# Patient Record
Sex: Female | Born: 1945 | Race: White | Hispanic: No | Marital: Married | Smoking: Former smoker
Health system: Southern US, Community
[De-identification: ages and names within clinical notes are randomized; demographics above are authoritative.]

## PROBLEM LIST (undated history)

## (undated) DIAGNOSIS — G4730 Sleep apnea, unspecified: Secondary | ICD-10-CM

## (undated) DIAGNOSIS — J439 Emphysema, unspecified: Secondary | ICD-10-CM

## (undated) DIAGNOSIS — E785 Hyperlipidemia, unspecified: Secondary | ICD-10-CM

## (undated) DIAGNOSIS — G4761 Periodic limb movement disorder: Secondary | ICD-10-CM

## (undated) DIAGNOSIS — D259 Leiomyoma of uterus, unspecified: Secondary | ICD-10-CM

## (undated) DIAGNOSIS — I10 Essential (primary) hypertension: Secondary | ICD-10-CM

## (undated) HISTORY — PX: KNEE ARTHROSCOPY: SUR90

## (undated) HISTORY — PX: APPENDECTOMY: SHX54

## (undated) HISTORY — DX: Hyperlipidemia, unspecified: E78.5

## (undated) HISTORY — DX: Essential (primary) hypertension: I10

## (undated) HISTORY — DX: Leiomyoma of uterus, unspecified: D25.9

## (undated) HISTORY — DX: Periodic limb movement disorder: G47.61

---

## 1996-04-27 HISTORY — PX: BILATERAL SALPINGOOPHORECTOMY: SHX1223

## 1996-04-27 HISTORY — PX: TOTAL ABDOMINAL HYSTERECTOMY: SHX209

## 2003-08-30 ENCOUNTER — Encounter (INDEPENDENT_AMBULATORY_CARE_PROVIDER_SITE_OTHER): Payer: Self-pay | Admitting: *Deleted

## 2003-08-30 ENCOUNTER — Ambulatory Visit (HOSPITAL_COMMUNITY): Admission: RE | Admit: 2003-08-30 | Discharge: 2003-08-30 | Payer: Self-pay | Admitting: Gastroenterology

## 2007-08-18 ENCOUNTER — Ambulatory Visit: Payer: Self-pay | Admitting: Pulmonary Disease

## 2007-08-18 DIAGNOSIS — I10 Essential (primary) hypertension: Secondary | ICD-10-CM | POA: Insufficient documentation

## 2007-08-18 DIAGNOSIS — E785 Hyperlipidemia, unspecified: Secondary | ICD-10-CM | POA: Insufficient documentation

## 2007-09-12 ENCOUNTER — Ambulatory Visit: Payer: Self-pay | Admitting: Pulmonary Disease

## 2007-09-12 DIAGNOSIS — J449 Chronic obstructive pulmonary disease, unspecified: Secondary | ICD-10-CM | POA: Insufficient documentation

## 2007-10-21 ENCOUNTER — Telehealth (INDEPENDENT_AMBULATORY_CARE_PROVIDER_SITE_OTHER): Payer: Self-pay | Admitting: *Deleted

## 2007-10-24 ENCOUNTER — Encounter: Payer: Self-pay | Admitting: Pulmonary Disease

## 2007-11-22 ENCOUNTER — Ambulatory Visit: Payer: Self-pay | Admitting: Pulmonary Disease

## 2008-04-23 ENCOUNTER — Ambulatory Visit: Payer: Self-pay | Admitting: Pulmonary Disease

## 2008-04-23 DIAGNOSIS — G2589 Other specified extrapyramidal and movement disorders: Secondary | ICD-10-CM | POA: Insufficient documentation

## 2008-06-22 ENCOUNTER — Ambulatory Visit: Payer: Self-pay | Admitting: Pulmonary Disease

## 2009-06-19 ENCOUNTER — Ambulatory Visit: Payer: Self-pay | Admitting: Pulmonary Disease

## 2009-06-19 DIAGNOSIS — J310 Chronic rhinitis: Secondary | ICD-10-CM | POA: Insufficient documentation

## 2010-04-01 ENCOUNTER — Telehealth (INDEPENDENT_AMBULATORY_CARE_PROVIDER_SITE_OTHER): Payer: Self-pay | Admitting: *Deleted

## 2010-04-10 ENCOUNTER — Ambulatory Visit: Payer: Self-pay | Admitting: Pulmonary Disease

## 2010-04-10 DIAGNOSIS — R011 Cardiac murmur, unspecified: Secondary | ICD-10-CM | POA: Insufficient documentation

## 2010-05-26 ENCOUNTER — Other Ambulatory Visit: Payer: Self-pay | Admitting: Gastroenterology

## 2010-05-27 NOTE — Progress Notes (Signed)
Summary: refills adviar 250-71mcg, spiriva, proair to [facility]  Phone Note Call from Patient Call back at [phone]   Caller: Patient Call For: sood Reason for Call: Refill Medication, Talk to Nurse Summary of Call: Patient needing refills--advair, proair, and spiriva.  [facility]  Pt. made an appt for 11/15 @ 9:30 Initial call taken by: Lehman Prom,  April 01, 2010 9:39 AM  Follow-up for Phone Call        called spoke with patient who requests refills on her spiriva, advair 250-32mcg and proair.  she has made appt w/ VS on 12.15.11 @ 0930.  refills sent to [facility].  pt advised to keep 12.15 appt. Follow-up by: Boone Master CNA/MA,  April 01, 2010 10:26 AM    Prescriptions: PROAIR HFA 108 (90 BASE) MCG/ACT  AERS (ALBUTEROL SULFATE) Two puffs four times daily as needed  #3 x 3   Entered by:   Boone Master CNA/MA   Authorized by:   Coralyn Helling MD   Signed by:   Boone Master CNA/MA on 04/01/2010   Method used:   Faxed to ...       [facility] (mail-order)       [address] [place], [place]  [address]       Ph: [phone]       Fax: [phone]   RxID:   [id number] ADVAIR DISKUS 250-50 MCG/DOSE  MISC (FLUTICASONE-SALMETEROL) Use one puff two times a day - rinse mouth.  #3 x 3   Entered by:   Boone Master CNA/MA   Authorized by:   Coralyn Helling MD   Signed by:   Boone Master CNA/MA on 04/01/2010   Method used:   Faxed to ...       [facility] (mail-order)       [address] [place], [place]  [address]       Ph: [phone]       Fax: [phone]   RxID:   [id number] SPIRIVA HANDIHALER 18 MCG  CAPS (TIOTROPIUM BROMIDE MONOHYDRATE) 1 puff once daily  #90 x 3   Entered by:   Boone Master CNA/MA   Authorized by:   Coralyn Helling MD   Signed by:   Boone Master CNA/MA on 04/01/2010   Method used:   Faxed to ...       [facility] (mail-order)       [address] [place], [place]  [address]       Ph: [phone]       Fax:  [phone]   RxID:   [id number]

## 2010-05-27 NOTE — Assessment & Plan Note (Signed)
Summary: rov/ mbw   Copy to:  Dr. Rodrigo Ran Primary Provider/Referring Provider:  Dr. Rodrigo Ran  CC:  Follow up visit-Chronic Asthma; Pt states her breathing is about the same as last visit; Does get SOB but with walking great distance. Send Sprivia, advair, and proair to [facility] for pt..  History of Present Illness: I saw Ms. Mutch in follow up for her chronic obstructive asthma.  She has been doing okay.  She uses her proair twice per day.  She gets winded when walking.  Otherwise she is not having much cough, wheeze, or sputum.  She has been getting more pain in her joints.  She has been getting sinus congestion, post-nasal drip, and hoarseness.  Current Medications (verified): 1)  Spiriva Handihaler 18 Mcg  Caps (Tiotropium Bromide Monohydrate) .Marland Kitchen.. 1 Puff Once Daily 2)  Advair Diskus 250-50 Mcg/dose  Misc (Fluticasone-Salmeterol) .... Use One Puff Two Times A Day - Rinse Mouth. 3)  Proair Hfa 108 (90 Base) Mcg/act  Aers (Albuterol Sulfate) .... Two Puffs Four Times Daily As Needed 4)  Diovan Hct 320-25 Mg  Tabs (Valsartan-Hydrochlorothiazide) .... One Table Daily. 5)  Crestor 10 Mg  Tabs (Rosuvastatin Calcium) .... One Tablet Daily. 6)  Fish Oil Concentrate 300 Mg  Caps (Omega-3 Fatty Acids) .... Use Four Daily. 7)  Adult Aspirin Ec Low Strength 81 Mg  Tbec (Aspirin) .... One Tablet Daily. 8)  Fosamax 70 Mg  Tabs (Alendronate Sodium) .... Take One Tablet Weekly. 9)  Calcium 600 1500 Mg  Tabs (Calcium Carbonate) .... One Tablet Twice Daily.  Allergies (verified): No Known Drug Allergies  Past History:  Past Medical History: Reviewed history from 06/22/2008 and no changes required. Chronic Obstructive Asthma Hyperlipidemia Hypertension Uterine Fibroid Periodic Limb Movements Osteoporosis  Past Surgical History: Reviewed history from 08/18/2007 and no changes required. TAH and BSO 1998 Left Knee Arthroscopy Appendectomy  Vital Signs:  Patient profile:   65  year old female Height:      65 inches Weight:      306.38 pounds BMI:     51.17 O2 Sat:      94 % on Room air Temp:     98.1 degrees F oral Pulse rate:   100 / minute BP sitting:   156 / 88  (left arm) Cuff size:   large  Vitals Entered By: Reynaldo Minium CMA (June 19, 2009 1:51 PM)  O2 Flow:  Room air  Physical Exam  General:  normal appearance, healthy appearing, and obese.   Nose:  no sinus tenderness or discharge, narrow nasal angle Mouth:  Mallampati 3 airway, low laying soft palate Neck:  no LAN, no Thyromegaly, no JVD Lungs:  prolonged exhalation, no wheezing or rales Heart:  regular rhythm, normal rate, and no murmurs.   Extremities:  no edema, cynanosis, or clubbing Cervical Nodes:  no significant adenopathy   Impression & Recommendations:  Problem # 1:  CHRONIC OBSTRUCTIVE ASTHMA UNSPECIFIED (ICD-493.20) She is to continue on her asthma regimen.  Problem # 2:  DYSPNEA (ICD-786.05) Likely multifactorial.  I believe a good portion of this is related to her obesity, and deconditioning.  Offered referral to pulmonary rehab.  She will take this under consideration, and let me know if this is something she would be interested in doing.  Problem # 3:  RHINITIS (ICD-472.0) Advised her to use nasal irrigation, and try OTC claritin for the next 3 or 4 days.  Complete Medication List: 1)  Spiriva Handihaler 18 Mcg  Caps (Tiotropium bromide monohydrate) .Marland Kitchen.. 1 puff once daily 2)  Advair Diskus 250-50 Mcg/dose Misc (Fluticasone-salmeterol) .... Use one puff two times a day - rinse mouth. 3)  Proair Hfa 108 (90 Base) Mcg/act Aers (Albuterol sulfate) .... Two puffs four times daily as needed 4)  Diovan Hct 320-25 Mg Tabs (Valsartan-hydrochlorothiazide) .... One table daily. 5)  Crestor 10 Mg Tabs (Rosuvastatin calcium) .... One tablet daily. 6)  Fish Oil Concentrate 300 Mg Caps (Omega-3 fatty acids) .... Use four daily. 7)  Adult Aspirin Ec Low Strength 81 Mg Tbec (Aspirin)  .... One tablet daily. 8)  Fosamax 70 Mg Tabs (Alendronate sodium) .... Take one tablet weekly. 9)  Calcium 600 1500 Mg Tabs (Calcium carbonate) .... One tablet twice daily.  Other Orders: Est. Patient Level III (60454)  Patient Instructions: 1)  Try using neti pott or nasal irrigation 2)  Use claritin 10 mg once daily for 3 or 4 days once daily, then as needed  3)  Follow up in 6 months Prescriptions: PROAIR HFA 108 (90 BASE) MCG/ACT  AERS (ALBUTEROL SULFATE) Two puffs four times daily as needed  #3 x 4   Entered and Authorized by:   Coralyn Helling MD   Signed by:   Coralyn Helling MD on 06/19/2009   Method used:   Printed then faxed to ...       [facility] (mail-order)       [address] [place], [place]  [address]       Ph: [phone]       Fax: [phone]   RxID:   [id number] ADVAIR DISKUS 250-50 MCG/DOSE  MISC (FLUTICASONE-SALMETEROL) Use one puff two times a day - rinse mouth.  #3 x 4   Entered and Authorized by:   Coralyn Helling MD   Signed by:   Coralyn Helling MD on 06/19/2009   Method used:   Printed then faxed to ...       [facility] (mail-order)       [address] [place], [place]  [address]       Ph: [phone]       Fax: [phone]   RxID:   [id number] SPIRIVA HANDIHALER 18 MCG  CAPS (TIOTROPIUM BROMIDE MONOHYDRATE) 1 puff once daily  #90 x 4   Entered and Authorized by:   Coralyn Helling MD   Signed by:   Coralyn Helling MD on 06/19/2009   Method used:   Printed then faxed to ...       [facility] (mail-order)       [address] [place], [place]  [address]       Ph: [phone]       Fax: [phone]   RxID:   [id number]

## 2010-05-29 NOTE — Assessment & Plan Note (Signed)
Summary: per pt call/mhh   Visit Type:  Follow-up Copy to:  Dr. Rodrigo Ran Primary Provider/Referring Provider:  Dr. Rodrigo Ran  CC:  Asthma...pt states she is doing well...no complaints today.  History of Present Illness: 65 yo female with chronic obstructive asthma.  She has been doing well.  She does not have much cough, wheeze, or sputum.  She denies recent fever or hemoptysis.  She uses her proair once or twice per day.   Current Medications (verified): 1)  Spiriva Handihaler 18 Mcg  Caps (Tiotropium Bromide Monohydrate) .Marland Kitchen.. 1 Puff Once Daily 2)  Advair Diskus 250-50 Mcg/dose  Misc (Fluticasone-Salmeterol) .... Use One Puff Two Times A Day - Rinse Mouth. 3)  Proair Hfa 108 (90 Base) Mcg/act  Aers (Albuterol Sulfate) .... Two Puffs Four Times Daily As Needed 4)  Diovan Hct 320-25 Mg  Tabs (Valsartan-Hydrochlorothiazide) .... One Table Daily. 5)  Crestor 10 Mg  Tabs (Rosuvastatin Calcium) .... One Tablet Daily. 6)  Fish Oil Concentrate 300 Mg  Caps (Omega-3 Fatty Acids) .... Use Four Daily. 7)  Adult Aspirin Ec Low Strength 81 Mg  Tbec (Aspirin) .... One Tablet Daily. 8)  Fosamax 70 Mg  Tabs (Alendronate Sodium) .... Take One Tablet Weekly. 9)  Calcium 600 1500 Mg  Tabs (Calcium Carbonate) .... One Tablet Twice Daily.  Allergies (verified): No Known Drug Allergies  Past History:  Past Medical History: Reviewed history from 06/22/2008 and no changes required. Chronic Obstructive Asthma Hyperlipidemia Hypertension Uterine Fibroid Periodic Limb Movements Osteoporosis  Past Surgical History: Reviewed history from 08/18/2007 and no changes required. TAH and BSO 1998 Left Knee Arthroscopy Appendectomy  Vital Signs:  Patient profile:   65 year old female Height:      65 inches (165.10 cm) Weight:      286 pounds (130 kg) BMI:     47.76 O2 Sat:      95 % on Room air Temp:     97.9 degrees F (36.61 degrees C) oral Pulse rate:   96 / minute BP sitting:   132 / 80   (left arm) Cuff size:   large  Vitals Entered By: Michel Bickers CMA (April 10, 2010 9:13 AM)  O2 Sat at Rest %:  95 O2 Flow:  Room air CC: Asthma...pt states she is doing well...no complaints today Is Patient Diabetic? No Comments Medications reviewed with patient Michel Bickers [facility]  April 10, 2010 9:21 AM   Physical Exam  General:  normal appearance, healthy appearing, and obese.   Nose:  no sinus tenderness or discharge, narrow nasal angle Mouth:  Mallampati 3 airway, low laying soft palate Neck:  no JVD.   Lungs:  prolonged exhalation, no wheezing or rales Heart:  regular rhythm and normal rate, 2/6 systolic murmur Extremities:  no edema, cynanosis, or clubbing Neurologic:  normal CN II-XII and strength normal.   Cervical Nodes:  no significant adenopathy Psych:  alert and cooperative; normal mood and affect; normal attention span and concentration   Impression & Recommendations:  Problem # 1:  CHRONIC OBSTRUCTIVE ASTHMA UNSPECIFIED (ICD-493.20) Stable.  She is to continue on her current regimen.  Problem # 2:  MURMUR (ICD-785.2) She has a slight systolic murmur heard today which was not present previously.  She is going to see her primary physician on Dec 19.  Advised her to d/w primary care.  Complete Medication List: 1)  Spiriva Handihaler 18 Mcg Caps (Tiotropium bromide monohydrate) .Marland Kitchen.. 1 puff once daily 2)  Advair Diskus 250-50  Mcg/dose Misc (Fluticasone-salmeterol) .... Use one puff two times a day - rinse mouth. 3)  Proair Hfa 108 (90 Base) Mcg/act Aers (Albuterol sulfate) .... Two puffs four times daily as needed 4)  Diovan Hct 320-25 Mg Tabs (Valsartan-hydrochlorothiazide) .... One table daily. 5)  Crestor 10 Mg Tabs (Rosuvastatin calcium) .... One tablet daily. 6)  Fish Oil Concentrate 300 Mg Caps (Omega-3 fatty acids) .... Use four daily. 7)  Adult Aspirin Ec Low Strength 81 Mg Tbec (Aspirin) .... One tablet daily. 8)  Fosamax 70 Mg Tabs (Alendronate sodium)  .... Take one tablet weekly. 9)  Calcium 600 1500 Mg Tabs (Calcium carbonate) .... One tablet twice daily.  Other Orders: Est. Patient Level III (52841)  Patient Instructions: 1)  Follow up in 6 months   Immunization History:  Influenza Immunization History:    Influenza:  historical (12/26/2009)

## 2010-09-12 NOTE — Op Note (Signed)
Cassandra Hart, Cassandra Hart                           ACCOUNT NO.:  [id number]   MEDICAL RECORD NO.:  [id number]                   PATIENT TYPE:  AMB   LOCATION:  ENDO                                 FACILITY:  [facility]   PHYSICIAN:  Bernette Redbird, M.D.                DATE OF BIRTH:  Apr 21, 1946   DATE OF PROCEDURE:  08/30/2003  DATE OF DISCHARGE:                                 OPERATIVE REPORT   PROCEDURE:  Colonoscopy with biopsies.   INDICATIONS:  Colon cancer screening in an asymptomatic (except for  constipation) 65 year old female.   FINDINGS:  Several diminutive polyps seen.  Sigmoid diverticulosis and  fixation.   DESCRIPTION OF PROCEDURE:  The nature, purpose and risks of this procedure  have been discussed with the patient who provided written consent.  Sedation  was fentanyl 125 mcg and Versed 12 mg IV without arrhythmias or  desaturation.  The Olympus adult video coloscope was advanced with moderate  difficulty through fixated sigmoid region with diverticulosis around the  colon to the cecum as identified by visualization of the appendiceal  orifice.  We did have to turn the patient into the right lateral decubitus  position to facilitate entry into the cecum.  Pull-back was then performed.   In the proximal ascending colon, I encountered a couple of small sessile  which were removed by cold biopsy.  There were also several small sessile  polyps in the rectum, removed by cold biopsy technique.  No large polyps,  cancer, colitis or vascular malformations were observed, but there was a  fair amount of sigmoid fixation and a mild to moderate amount of sigmoid  diverticulosis.   The quality of the prep was excellent and it is felt that all areas on this  exam were well seen.  The patient tolerated the procedure well.  There were  no apparent complications.  I believe that retroflexion was performed in the  rectum.   IMPRESSION:  Multiple small colon polyps removed as described  above.  (211.3).   PLAN:  Await pathology results.                                               Bernette Redbird, M.D.    RB/MEDQ  D:  08/30/2003  T:  08/30/2003  Job:  [id number]   cc:   Loraine Leriche A. Waynard Edwards, M.D.  [address]  [place]  [place] [address]  Fax: [phone]

## 2010-10-07 ENCOUNTER — Ambulatory Visit: Payer: Self-pay | Admitting: Pulmonary Disease

## 2010-10-22 ENCOUNTER — Ambulatory Visit: Payer: Self-pay | Admitting: Pulmonary Disease

## 2010-11-24 ENCOUNTER — Ambulatory Visit: Payer: Self-pay | Admitting: Pulmonary Disease

## 2010-12-10 ENCOUNTER — Encounter: Payer: Self-pay | Admitting: Pulmonary Disease

## 2010-12-10 ENCOUNTER — Ambulatory Visit (INDEPENDENT_AMBULATORY_CARE_PROVIDER_SITE_OTHER): Payer: BC Managed Care – PPO | Admitting: Pulmonary Disease

## 2010-12-10 DIAGNOSIS — J310 Chronic rhinitis: Secondary | ICD-10-CM

## 2010-12-10 DIAGNOSIS — J449 Chronic obstructive pulmonary disease, unspecified: Secondary | ICD-10-CM

## 2010-12-10 NOTE — Patient Instructions (Signed)
Follow up in 6 months 

## 2010-12-10 NOTE — Progress Notes (Signed)
  Subjective:    Patient ID: Cassandra Hart, female    DOB: October 05, 1945, 65 y.o.   MRN: [id number]  HPI 65 yo female former smoker with chronic obstructive asthma.  She was treated with a Zpak for an sinus infection.  She has improved.  This did not settle into her chest.  She has not been having fever, cough, wheeze, sputum, chest tightness, or hemoptysis.  She is using her proair at least once every other day.  Past Medical History  Diagnosis Date  . Chronic obstructive asthma   . Hyperlipidemia   . Hypertension   . Uterine fibroid   . Periodic limb movement   . Osteoporosis     No Known Allergies   Review of Systems     Objective:   Physical Exam  BP 148/78  Pulse 93  Temp(Src) 98.3 F (36.8 C) (Oral)  Ht 5\' 5"  (1.651 m)  Wt 281 lb 6.4 oz (127.642 kg)  BMI 46.83 kg/m2  SpO2 93%  General - obese HEENT - clear nasal drainage, no sinus tender, no oral exudate, no LAN Cardiac - s1s2 no murmur Chest - no wheeze/rales Abd - soft, nontender Ext - minimal ankle edema Psych - normal mood/behavior Neuro - normal strength CN intact Skin - changes of vitiligo     Assessment & Plan:   CHRONIC OBSTRUCTIVE ASTHMA UNSPECIFIED Doing well.  She is to continue her current regimen.  RHINITIS She has recovered from recent sinus infection.    Updated Medication List Outpatient Encounter Prescriptions as of 12/10/2010  Medication Sig Dispense Refill  . albuterol (PROAIR HFA) 108 (90 BASE) MCG/ACT inhaler Inhale 2 puffs into the lungs every 6 (six) hours as needed.        Marland Kitchen alendronate (FOSAMAX) 70 MG tablet Take 70 mg by mouth every 7 (seven) days. Take with a full glass of water on an empty stomach.       Marland Kitchen aspirin 81 MG tablet Take 81 mg by mouth daily.        . Calcium Carbonate (CALCIUM 600) 1500 MG TABS Take by mouth 2 (two) times daily.        . fish oil-omega-3 fatty acids 1000 MG capsule Take 2 g by mouth daily. Take 4 daily.       . Fluticasone-Salmeterol (ADVAIR  DISKUS) 250-50 MCG/DOSE AEPB Inhale 1 puff into the lungs every 12 (twelve) hours.        . rosuvastatin (CRESTOR) 10 MG tablet Take 10 mg by mouth daily.        Marland Kitchen tiotropium (SPIRIVA HANDIHALER) 18 MCG inhalation capsule Place 18 mcg into inhaler and inhale daily.        . traMADol (ULTRAM) 50 MG tablet As needed      . valsartan-hydrochlorothiazide (DIOVAN-HCT) 320-25 MG per tablet Take 1 tablet by mouth daily.        . Vitamin D, Ergocalciferol, (DRISDOL) 50000 UNITS CAPS Once a day

## 2010-12-10 NOTE — Assessment & Plan Note (Signed)
Doing well.  She is to continue her current regimen.

## 2010-12-10 NOTE — Assessment & Plan Note (Signed)
She has recovered from recent sinus infection.

## 2011-04-09 ENCOUNTER — Telehealth: Payer: Self-pay | Admitting: Pulmonary Disease

## 2011-04-09 MED ORDER — TIOTROPIUM BROMIDE MONOHYDRATE 18 MCG IN CAPS: 18.0000 ug | ORAL_CAPSULE | Freq: Every day | RESPIRATORY_TRACT | Status: DC

## 2011-04-09 NOTE — Telephone Encounter (Signed)
Pt aware that Rx has been sent to [facility] for 90 day supply-will get new RX when seeing VS on 05-31-10 at 245pm.

## 2011-05-04 ENCOUNTER — Telehealth: Payer: Self-pay | Admitting: Pulmonary Disease

## 2011-05-04 ENCOUNTER — Other Ambulatory Visit: Payer: Self-pay | Admitting: Pulmonary Disease

## 2011-05-04 MED ORDER — FLUTICASONE-SALMETEROL 250-50 MCG/DOSE IN AEPB: 1.0000 | INHALATION_SPRAY | Freq: Two times a day (BID) | RESPIRATORY_TRACT | Status: DC

## 2011-05-04 NOTE — Telephone Encounter (Signed)
Prescription refill already sent to [facility] today. Nothing further needed.

## 2011-05-04 NOTE — Telephone Encounter (Signed)
Pt informed that refill for Advair was sent ot [facility]

## 2011-06-01 ENCOUNTER — Ambulatory Visit (INDEPENDENT_AMBULATORY_CARE_PROVIDER_SITE_OTHER): Payer: BC Managed Care – PPO | Admitting: Pulmonary Disease

## 2011-06-01 ENCOUNTER — Encounter: Payer: Self-pay | Admitting: Pulmonary Disease

## 2011-06-01 DIAGNOSIS — J449 Chronic obstructive pulmonary disease, unspecified: Secondary | ICD-10-CM

## 2011-06-01 DIAGNOSIS — J310 Chronic rhinitis: Secondary | ICD-10-CM

## 2011-06-01 MED ORDER — FLUTICASONE-SALMETEROL 250-50 MCG/DOSE IN AEPB: 1.0000 | INHALATION_SPRAY | Freq: Two times a day (BID) | RESPIRATORY_TRACT | Status: DC

## 2011-06-01 MED ORDER — ALBUTEROL SULFATE HFA 108 (90 BASE) MCG/ACT IN AERS: 2.0000 | INHALATION_SPRAY | Freq: Four times a day (QID) | RESPIRATORY_TRACT | Status: DC | PRN

## 2011-06-01 MED ORDER — TIOTROPIUM BROMIDE MONOHYDRATE 18 MCG IN CAPS: 18.0000 ug | ORAL_CAPSULE | Freq: Every day | RESPIRATORY_TRACT | Status: DC

## 2011-06-01 NOTE — Assessment & Plan Note (Signed)
Stable

## 2011-06-01 NOTE — Patient Instructions (Signed)
Follow up in 6 months 

## 2011-06-01 NOTE — Assessment & Plan Note (Signed)
Stable on current regimen   

## 2011-06-01 NOTE — Progress Notes (Signed)
Chief Complaint  Patient presents with  . Follow-up    Pt states her breathing has unchanged. Pt c/o occas cough w/ clear phlem. denies any wheezing, chest tightness    History of Present Illness: Cassandra Hart is a 66 y.o. female former smoker with chronic obstructive asthma.  She has been doing okay.  She uses her proair about 5 to 10 times per week.  She has occasional cough with clear sputum.  Her sinuses are doing okay.  She denies fever, sore throat, chest pain, skin rash, or hemoptysis.   Past Medical History  Diagnosis Date  . Chronic obstructive asthma   . Hyperlipidemia   . Hypertension   . Uterine fibroid   . Periodic limb movement   . Osteoporosis     Past Surgical History  Procedure Date  . Total abdominal hysterectomy 1998  . Bilateral salpingoophorectomy 1998  . Knee arthroscopy     left  . Appendectomy     No Known Allergies  Physical Exam:  Blood pressure 130/76, pulse 107, temperature 98.4 F (36.9 C), temperature source Oral, height 5\' 5"  (1.651 m), weight 291 lb 9.6 oz (132.269 kg), SpO2 93.00%. Body mass index is 48.52 kg/(m^2).  Wt Readings from Last 2 Encounters:  06/01/11 291 lb 9.6 oz (132.269 kg)  12/10/10 281 lb 6.4 oz (127.642 kg)   General - obese  HEENT - clear nasal drainage, no sinus tender, no oral exudate, no LAN  Cardiac - s1s2 no murmur  Chest - faint wheeze at left base that clear with deep breath Abd - soft, nontender  Ext - no edema Psych - normal mood/behavior  Neuro - normal strength, CN intact  Skin - changes of vitiligo  Assessment/Plan:  Outpatient Encounter Prescriptions as of 06/01/2011  Medication Sig Dispense Refill  . albuterol (PROAIR HFA) 108 (90 BASE) MCG/ACT inhaler Inhale 2 puffs into the lungs every 6 (six) hours as needed.        Marland Kitchen alendronate (FOSAMAX) 70 MG tablet Take 70 mg by mouth every 7 (seven) days. Take with a full glass of water on an empty stomach.       Marland Kitchen aspirin 81 MG tablet Take 81 mg by  mouth daily.        . Calcium Carbonate (CALCIUM 600) 1500 MG TABS Take by mouth 2 (two) times daily.        . fish oil-omega-3 fatty acids 1000 MG capsule Take 2 g by mouth daily. Take 4 daily.       . Fluticasone-Salmeterol (ADVAIR DISKUS) 250-50 MCG/DOSE AEPB Inhale 1 puff into the lungs every 12 (twelve) hours.  60 each  3  . rosuvastatin (CRESTOR) 10 MG tablet Take 10 mg by mouth daily.        Marland Kitchen tiotropium (SPIRIVA HANDIHALER) 18 MCG inhalation capsule Place 1 capsule (18 mcg total) into inhaler and inhale daily.  90 capsule  0  . traMADol (ULTRAM) 50 MG tablet As needed      . valsartan-hydrochlorothiazide (DIOVAN-HCT) 320-25 MG per tablet Take 1 tablet by mouth daily.        . Vitamin D, Ergocalciferol, (DRISDOL) 50000 UNITS CAPS Once a day        Yug Loria Pager:  [phone] 06/01/2011, 3:16 PM

## 2011-07-21 ENCOUNTER — Other Ambulatory Visit: Payer: Self-pay | Admitting: Pulmonary Disease

## 2011-07-21 MED ORDER — TIOTROPIUM BROMIDE MONOHYDRATE 18 MCG IN CAPS: 18.0000 ug | ORAL_CAPSULE | Freq: Every day | RESPIRATORY_TRACT | Status: DC

## 2011-12-14 ENCOUNTER — Ambulatory Visit (INDEPENDENT_AMBULATORY_CARE_PROVIDER_SITE_OTHER): Payer: Medicare Other | Admitting: Pulmonary Disease

## 2011-12-14 ENCOUNTER — Encounter: Payer: Self-pay | Admitting: Pulmonary Disease

## 2011-12-14 VITALS — BP 154/78 | HR 111 | Temp 98.3°F | Ht 65.5 in | Wt 294.8 lb

## 2011-12-14 DIAGNOSIS — J449 Chronic obstructive pulmonary disease, unspecified: Secondary | ICD-10-CM

## 2011-12-14 MED ORDER — TIOTROPIUM BROMIDE MONOHYDRATE 18 MCG IN CAPS: 18.0000 ug | ORAL_CAPSULE | Freq: Every day | RESPIRATORY_TRACT | Status: DC

## 2011-12-14 MED ORDER — ALBUTEROL SULFATE HFA 108 (90 BASE) MCG/ACT IN AERS: 2.0000 | INHALATION_SPRAY | Freq: Four times a day (QID) | RESPIRATORY_TRACT | Status: DC | PRN

## 2011-12-14 MED ORDER — FLUTICASONE-SALMETEROL 250-50 MCG/DOSE IN AEPB: 1.0000 | INHALATION_SPRAY | Freq: Two times a day (BID) | RESPIRATORY_TRACT | Status: DC

## 2011-12-14 NOTE — Patient Instructions (Signed)
Follow up in one year Call if help needed sooner 

## 2011-12-14 NOTE — Assessment & Plan Note (Signed)
She has been stable on her current inhaler regimen.  Advised she can f/u in one year, and call if help needed sooner.

## 2011-12-14 NOTE — Progress Notes (Signed)
Chief Complaint  Patient presents with  . Follow-up    Breathing has unchanged, cough w/ clear phlem. denies any wheezing, chest tx.    History of Present Illness: Cassandra Hart is a 66 y.o. female former smoker with chronic obstructive asthma.  She has been doing well.  She retired in March.  She has been doing water aerobics twice per week, and feels this helps.  She uses her albuterol about twice per week.  She denies fever, sore throat, chest pain, skin rash, or hemoptysis.   Past Medical History  Diagnosis Date  . Chronic obstructive asthma   . Hyperlipidemia   . Hypertension   . Uterine fibroid   . Periodic limb movement   . Osteoporosis     Past Surgical History  Procedure Date  . Total abdominal hysterectomy 1998  . Bilateral salpingoophorectomy 1998  . Knee arthroscopy     left  . Appendectomy     No Known Allergies  Physical Exam:  Blood pressure 154/78, pulse 111, temperature 98.3 F (36.8 C), temperature source Oral, height 5' 5.5" (1.664 m), weight 294 lb 12.8 oz (133.72 kg), SpO2 92.00%. Body mass index is 48.31 kg/(m^2).  Wt Readings from Last 2 Encounters:  12/14/11 294 lb 12.8 oz (133.72 kg)  06/01/11 291 lb 9.6 oz (132.269 kg)   General - obese  HEENT - no sinus tender, no oral exudate, no LAN  Cardiac - s1s2 no murmur  Chest - no wheeze/rales/dullness Abd - soft, nontender  Ext - no edema Psych - normal mood/behavior  Neuro - normal strength, CN intact  Skin - changes of vitiligo  Assessment/Plan:  Outpatient Encounter Prescriptions as of 12/14/2011  Medication Sig Dispense Refill  . albuterol (PROAIR HFA) 108 (90 BASE) MCG/ACT inhaler Inhale 2 puffs into the lungs every 6 (six) hours as needed.  3 Inhaler  3  . aspirin 81 MG tablet Take 81 mg by mouth daily.        . Calcium Carbonate (CALCIUM 600) 1500 MG TABS Take by mouth 2 (two) times daily.        . fish oil-omega-3 fatty acids 1000 MG capsule Take 2 g by mouth daily. Take 4  daily.       . Fluticasone-Salmeterol (ADVAIR DISKUS) 250-50 MCG/DOSE AEPB Inhale 1 puff into the lungs every 12 (twelve) hours.  180 each  3  . rosuvastatin (CRESTOR) 10 MG tablet Take 10 mg by mouth daily.        Marland Kitchen tiotropium (SPIRIVA HANDIHALER) 18 MCG inhalation capsule Place 1 capsule (18 mcg total) into inhaler and inhale daily.  90 capsule  3  . traMADol (ULTRAM) 50 MG tablet As needed      . valsartan-hydrochlorothiazide (DIOVAN-HCT) 320-25 MG per tablet Take 1 tablet by mouth daily.        . Vitamin D, Ergocalciferol, (DRISDOL) 50000 UNITS CAPS Once a day      . DISCONTD: albuterol (PROAIR HFA) 108 (90 BASE) MCG/ACT inhaler Inhale 2 puffs into the lungs every 6 (six) hours as needed.  3 Inhaler  3  . DISCONTD: Fluticasone-Salmeterol (ADVAIR DISKUS) 250-50 MCG/DOSE AEPB Inhale 1 puff into the lungs every 12 (twelve) hours.  180 each  3  . DISCONTD: tiotropium (SPIRIVA HANDIHALER) 18 MCG inhalation capsule Place 1 capsule (18 mcg total) into inhaler and inhale daily.  90 capsule  0  . DISCONTD: alendronate (FOSAMAX) 70 MG tablet Take 70 mg by mouth every 7 (seven) days. Take with a full  glass of water on an empty stomach.         Delayne Sanzo Pager:  [phone] 12/14/2011, 10:55 AM

## 2012-12-01 ENCOUNTER — Ambulatory Visit: Payer: Medicare Other | Admitting: Pulmonary Disease

## 2012-12-29 ENCOUNTER — Other Ambulatory Visit: Payer: Self-pay | Admitting: Pulmonary Disease

## 2013-01-10 ENCOUNTER — Other Ambulatory Visit: Payer: Self-pay | Admitting: Pulmonary Disease

## 2013-01-16 ENCOUNTER — Ambulatory Visit: Payer: Self-pay | Admitting: Pulmonary Disease

## 2013-01-19 ENCOUNTER — Ambulatory Visit: Payer: Self-pay | Admitting: Pulmonary Disease

## 2013-01-26 ENCOUNTER — Other Ambulatory Visit: Payer: Self-pay | Admitting: Pulmonary Disease

## 2013-02-09 ENCOUNTER — Other Ambulatory Visit: Payer: Self-pay | Admitting: Pulmonary Disease

## 2013-02-24 ENCOUNTER — Other Ambulatory Visit: Payer: Self-pay | Admitting: *Deleted

## 2013-02-24 MED ORDER — FLUTICASONE-SALMETEROL 250-50 MCG/DOSE IN AEPB: 1.0000 | INHALATION_SPRAY | Freq: Two times a day (BID) | RESPIRATORY_TRACT | Status: DC

## 2013-03-09 ENCOUNTER — Other Ambulatory Visit: Payer: Self-pay | Admitting: Pulmonary Disease

## 2013-03-13 ENCOUNTER — Ambulatory Visit (INDEPENDENT_AMBULATORY_CARE_PROVIDER_SITE_OTHER)
Admission: RE | Admit: 2013-03-13 | Discharge: 2013-03-13 | Disposition: A | Discharge status: Home / Self Care | Payer: Medicare Other | Source: Ambulatory Visit | Attending: Pulmonary Disease | Admitting: Pulmonary Disease

## 2013-03-13 ENCOUNTER — Encounter: Payer: Self-pay | Admitting: Pulmonary Disease

## 2013-03-13 ENCOUNTER — Ambulatory Visit (INDEPENDENT_AMBULATORY_CARE_PROVIDER_SITE_OTHER): Payer: Medicare Other | Admitting: Pulmonary Disease

## 2013-03-13 VITALS — BP 142/88 | HR 93 | Ht 66.0 in | Wt 309.0 lb

## 2013-03-13 DIAGNOSIS — R0600 Dyspnea, unspecified: Secondary | ICD-10-CM

## 2013-03-13 DIAGNOSIS — R0989 Other specified symptoms and signs involving the circulatory and respiratory systems: Secondary | ICD-10-CM

## 2013-03-13 DIAGNOSIS — J449 Chronic obstructive pulmonary disease, unspecified: Secondary | ICD-10-CM

## 2013-03-13 DIAGNOSIS — R0609 Other forms of dyspnea: Secondary | ICD-10-CM

## 2013-03-13 NOTE — Progress Notes (Signed)
Chief Complaint  Patient presents with  . Asthma    Breathing is worse. Reports SOB, chest tightness and coughing.    History of Present Illness: Cassandra Hart is a 67 y.o. female former smoker with chronic obstructive asthma.  I last saw Cassandra Hart in August 2013.    Since then she has notice a gradual, progressive worsening of her breathing.  She is okay at rest, but gets winded and "wheezy" with activity.  She will also feel tightness in her chest.  She has occasional cough, but not much sputum.  She denies hemoptysis.    She has not had difficulties with allergies, her sinuses, or sore throat.  She denies chest pain.  She has leg swelling, but this has been chronic.    She uses proair daily, and this helps sometimes.  She has not had any other significant changes to her health or medications over the past year, and does not recall any specific event that triggered her current symptoms.  She does not have coughing or gasping at night.  She reports previously having a sleep study that was negative for sleep apnea.   TESTS: Spirometry 04/23/08 >> FEV1 1.78(76%), FEV1% 69, FEF 25-75 1.09(41%)  She  has a past medical history of Chronic obstructive asthma; Hyperlipidemia; Hypertension; Uterine fibroid; Periodic limb movement; and Osteoporosis.  She  has past surgical history that includes Total abdominal hysterectomy (1998); Bilateral salpingoophorectomy (1998); Knee arthroscopy; and Appendectomy.  Current Outpatient Prescriptions on File Prior to Visit  Medication Sig Dispense Refill  . albuterol (PROAIR HFA) 108 (90 BASE) MCG/ACT inhaler Inhale 2 puffs into the lungs every 6 (six) hours as needed.  3 Inhaler  3  . aspirin 81 MG tablet Take 81 mg by mouth daily.        . Fluticasone-Salmeterol (ADVAIR DISKUS) 250-50 MCG/DOSE AEPB Inhale 1 puff into the lungs 2 (two) times daily.  60 each  2  . rosuvastatin (CRESTOR) 10 MG tablet Take 10 mg by mouth daily.        Marland Kitchen SPIRIVA HANDIHALER  18 MCG inhalation capsule INHALE ONE DOSE ONCE DAILY-MUST BE SEEN  30 capsule  0  . traMADol (ULTRAM) 50 MG tablet As needed      . valsartan-hydrochlorothiazide (DIOVAN-HCT) 320-25 MG per tablet Take 1 tablet by mouth daily.         No current facility-administered medications on file prior to visit.    No Known Allergies  Physical Exam:  General - obese  HEENT - no sinus tender, no oral exudate, no LAN  Cardiac - s1s2 no murmur  Chest - no wheeze/rales/dullness Abd - soft, nontender  Ext - 1+ ankle edema b/l  Psych - normal mood/behavior  Neuro - normal strength, CN intact  Skin - changes of vitiligo  Assessment/Plan:   Coralyn Helling, MD [facility] Pulmonary/Critical Care 03/14/2013, 8:13 AM Pager:  [phone] After 3pm call: [phone]

## 2013-03-13 NOTE — Patient Instructions (Signed)
Chest xray today  Will schedule pulmonary function test  Follow up in 4 weeks 

## 2013-03-13 NOTE — Assessment & Plan Note (Addendum)
She is to continue her current inhaler regimen with advair, spiriva, and prn proair.  I have given her samples of advair and spiriva.

## 2013-03-14 ENCOUNTER — Telehealth: Payer: Self-pay | Admitting: Pulmonary Disease

## 2013-03-14 DIAGNOSIS — R0600 Dyspnea, unspecified: Secondary | ICD-10-CM | POA: Insufficient documentation

## 2013-03-14 NOTE — Telephone Encounter (Signed)
Dg Chest 2 View  03/13/2013   CLINICAL DATA:  Chronic obstructive asthma, former smoker, COPD, hypertension  EXAM: CHEST  2 VIEW  COMPARISON:  08/18/2007  FINDINGS: Normal heart size, mediastinal contours, and pulmonary vascularity.  Minimal chronic peribronchial thickening.  No pulmonary infiltrate, pleural effusion or pneumothorax.  Few scattered endplate spurs lower thoracic spine.  IMPRESSION: Minimal chronic bronchitic changes without infiltrate.   Electronically Signed   By: Ulyses Southward M.D.   On: 03/13/2013 16:47    Will have my nurse inform pt that CXR shows expected changes from asthma >> these are mild.  No change to current treatment plan.  Will discuss in more detail at next visit.

## 2013-03-14 NOTE — Telephone Encounter (Signed)
Pt is aware of CXR results.

## 2013-03-14 NOTE — Assessment & Plan Note (Signed)
She reports progressive dyspnea with exertion.  She has prior history of obstructive asthma, but her current symptoms are not completely compatible with asthma.  Will begin with chest xray and pulmonary function testing.  She does have history of hypertension, and could have diastolic dysfunction.  She may need Echo and further cardiac assessment.  She is obese, and has significant exercise limitation due to arthritic complaints.  She likely has a component of deconditioning >> in fact this may be the most significant component of her dyspnea.  There is nothing in her history or exam to suggest thrombo-embolic disease or muscle weakness, and will defer evaluation of these for now.

## 2013-04-12 ENCOUNTER — Ambulatory Visit: Payer: Medicare Other | Admitting: Pulmonary Disease

## 2013-04-17 ENCOUNTER — Other Ambulatory Visit: Payer: Self-pay | Admitting: Pulmonary Disease

## 2013-05-09 ENCOUNTER — Ambulatory Visit (INDEPENDENT_AMBULATORY_CARE_PROVIDER_SITE_OTHER): Payer: Medicare Other | Admitting: Pulmonary Disease

## 2013-05-09 ENCOUNTER — Encounter: Payer: Self-pay | Admitting: Pulmonary Disease

## 2013-05-09 VITALS — BP 136/76 | HR 90 | Ht 65.0 in | Wt 308.0 lb

## 2013-05-09 DIAGNOSIS — R0989 Other specified symptoms and signs involving the circulatory and respiratory systems: Secondary | ICD-10-CM

## 2013-05-09 DIAGNOSIS — R0600 Dyspnea, unspecified: Secondary | ICD-10-CM

## 2013-05-09 DIAGNOSIS — J449 Chronic obstructive pulmonary disease, unspecified: Secondary | ICD-10-CM

## 2013-05-09 DIAGNOSIS — R0609 Other forms of dyspnea: Secondary | ICD-10-CM

## 2013-05-09 MED ORDER — FLUTICASONE-SALMETEROL 250-50 MCG/DOSE IN AEPB: 1.0000 | INHALATION_SPRAY | Freq: Two times a day (BID) | RESPIRATORY_TRACT | Status: DC

## 2013-05-09 MED ORDER — TIOTROPIUM BROMIDE MONOHYDRATE 18 MCG IN CAPS: 18.0000 ug | ORAL_CAPSULE | Freq: Every day | RESPIRATORY_TRACT | Status: DC

## 2013-05-09 MED ORDER — ALBUTEROL SULFATE HFA 108 (90 BASE) MCG/ACT IN AERS: 2.0000 | INHALATION_SPRAY | Freq: Four times a day (QID) | RESPIRATORY_TRACT | Status: DC | PRN

## 2013-05-09 NOTE — Patient Instructions (Signed)
Follow up in 6 months 

## 2013-05-09 NOTE — Progress Notes (Signed)
PFT done today. 

## 2013-05-09 NOTE — Progress Notes (Signed)
Chief Complaint  Patient presents with  . Asthma    Breathing is unchanged. Reports DOE, coughing at times. Denies chest tightness or wheezing at this time.    History of Present Illness: Cassandra Hart is a 68 y.o. female former smoker with chronic obstructive asthma.  She is here to review her PFT's >> this showed very mild obstruction.  Her breathing has been stable.  She gets occasional cough.  She feels her inhalers work well.  She uses albuterol a few times per week.  She has been doing water aerobics.  She has trouble with other activities because of arthritis.  She is not having wheeze, sputum, or chest pain.  TESTS: Spirometry 04/23/08 >> FEV1 1.78(76%), FEV1% 69, FEF 25-75 1.09(41%) PFT 05/09/13 >> FEV1 1.74 (70%), FEV1% 70, TLC 5.49 (105%), DLCO 88%, no BD  She  has a past medical history of Chronic obstructive asthma; Hyperlipidemia; Hypertension; Uterine fibroid; Periodic limb movement; and Osteoporosis.  She  has past surgical history that includes Total abdominal hysterectomy (1998); Bilateral salpingoophorectomy (1998); Knee arthroscopy; and Appendectomy.  Current Outpatient Prescriptions on File Prior to Visit  Medication Sig Dispense Refill  . amLODipine (NORVASC) 5 MG tablet Take 5 mg by mouth daily.      Marland Kitchen aspirin 81 MG tablet Take 81 mg by mouth daily.        . cholecalciferol (VITAMIN D) 1000 UNITS tablet Take 1,000 Units by mouth 2 (two) times daily.      . cyanocobalamin 1000 MCG tablet Take 100 mcg by mouth daily.      . rosuvastatin (CRESTOR) 10 MG tablet Take 10 mg by mouth daily.        . traMADol (ULTRAM) 50 MG tablet As needed      . valsartan-hydrochlorothiazide (DIOVAN-HCT) 320-25 MG per tablet Take 1 tablet by mouth daily.         No current facility-administered medications on file prior to visit.    No Known Allergies  Physical Exam:  General - obese  HEENT - no sinus tender, no oral exudate, no LAN  Cardiac - s1s2 no murmur  Chest - no  wheeze/rales/dullness Abd - soft, nontender  Ext - 1+ ankle edema b/l  Psych - normal mood/behavior  Neuro - normal strength, CN intact  Skin - changes of vitiligo  Assessment/Plan:   Chesley Mires, MD [facility] 05/09/2013, 11:39 AM Pager:  [phone] After 3pm call: [phone]

## 2013-05-09 NOTE — Assessment & Plan Note (Signed)
Stable on current inhaler regimen.  We discussed option of step down therapy, but she feels she needs to continue her current inhaler regimen.

## 2013-05-09 NOTE — Assessment & Plan Note (Signed)
Related to COPD/asthma, obesity with deconditioning, arthritis, and possible diastolic dysfunction.  She does not need additional pulmonary testing at this time.  Encouraged her to keep up with her exercise regimen.  Advised her to d/w her PCP about whether she needs further assessment of her heart function.

## 2013-11-25 LAB — PULMONARY FUNCTION TEST
DL/VA % pred: 96 %
DL/VA: 4.73 ml/min/mmHg/L
DLCO UNC: 22.63 ml/min/mmHg
DLCO unc % pred: 88 %
FEF 25-75 POST: 1.03 L/s
FEF 25-75 Pre: 1.14 L/sec
FEF2575-%CHANGE-POST: -9 %
FEF2575-%PRED-PRE: 54 %
FEF2575-%Pred-Post: 48 %
FEV1-%Change-Post: -1 %
FEV1-%PRED-PRE: 70 %
FEV1-%Pred-Post: 69 %
FEV1-POST: 1.71 L
FEV1-Pre: 1.74 L
FEV1FVC-%CHANGE-POST: 1 %
FEV1FVC-%PRED-PRE: 90 %
FEV6-%CHANGE-POST: -1 %
FEV6-%Pred-Post: 77 %
FEV6-%Pred-Pre: 79 %
FEV6-Post: 2.42 L
FEV6-Pre: 2.45 L
FEV6FVC-%Change-Post: 0 %
FEV6FVC-%PRED-POST: 104 %
FEV6FVC-%Pred-Pre: 103 %
FVC-%CHANGE-POST: -2 %
FVC-%Pred-Post: 74 %
FVC-%Pred-Pre: 76 %
FVC-Post: 2.42 L
FVC-Pre: 2.48 L
POST FEV1/FVC RATIO: 71 %
PRE FEV1/FVC RATIO: 70 %
Post FEV6/FVC ratio: 100 %
Pre FEV6/FVC Ratio: 100 %
RV % pred: 117 %
RV: 2.56 L
TLC % pred: 105 %
TLC: 5.49 L

## 2014-03-07 ENCOUNTER — Ambulatory Visit (INDEPENDENT_AMBULATORY_CARE_PROVIDER_SITE_OTHER): Payer: Medicare Other | Admitting: Neurology

## 2014-03-07 ENCOUNTER — Encounter: Payer: Self-pay | Admitting: Neurology

## 2014-03-07 VITALS — BP 150/78 | HR 91 | Temp 97.7°F | Resp 14 | Ht 66.25 in | Wt 306.5 lb

## 2014-03-07 DIAGNOSIS — G2581 Restless legs syndrome: Secondary | ICD-10-CM

## 2014-03-07 DIAGNOSIS — E1340 Other specified diabetes mellitus with diabetic neuropathy, unspecified: Secondary | ICD-10-CM

## 2014-03-07 DIAGNOSIS — E662 Morbid (severe) obesity with alveolar hypoventilation: Secondary | ICD-10-CM

## 2014-03-07 MED ORDER — TRAMADOL HCL 50 MG PO TABS: 50.0000 mg | ORAL_TABLET | Freq: Every evening | ORAL | Status: DC

## 2014-03-07 NOTE — Progress Notes (Signed)
SLEEP MEDICINE CLINIC   Provider:  Larey Seat, M D  Referring Provider: Jerlyn Ly, MD Primary Care Physician:  Jerlyn Ly, MD  Chief Complaint  Patient presents with  . NP Sleep    Rm 10, alone    HPI:  Cassandra Hart , a 68 y.o. caucasain, right handed female , seen here as a referral from Dr. Joylene Draft for a sleep evaluation,   The patient reports being diagnosed with asthma/ COPD and is treated with nebulizers. A ONO ordered by Dr Joylene Draft documented low oxygen levels , raising the suspecion that this patient has OSA.  This. Had been referred for a sleep study to [place] [facility], but it was still localized at the [facility]. On 09-08-2007 she had endorsed the Epworth score at 9 and the Becks inventory at 16 points, her BMI at the time was 45.8. She was diagnosed with an AHI of only 3.9 but accentuated during REM sleep to an AHI of 25.6. She had at that time only 15.9 minutes of desaturations throughout the night. Dr.Perini's overnight pulse oximetry documented that the patient state 420 minutes at a saturation of 89 or below percent. The question is if the patient needs just oxygen or  If by now  a development of OSA has taken place. The patient gained further weight since 2009 and has a high risk of obesity hypoventilation, BMI now about 50. CO2 may be retained.   The patient is retired and goes to bed between 11 and midnight, most of the time she falls asleep within 30 minutes, she has 2 bathroom break at night, She wakes often up spontaneously , not with headaches not with aches or pain or shortness of breath, no GERD. Usually goes t back to sleep within 30 minutes,  She rises still at 7.30 , (  During her work years she rose at 5.30 AM) and needs no alarm. She drinks coffees in AM , none during the day - average sleep time 7 hours nocturnal sleep and some days a 1 hour nap. Naps make her feel worse.           Review of Systems: Out of a complete 14 system review, the  patient complains of only the following symptoms, and all other reviewed systems are negative.  sleepiness, fatigued. No morning headaches, no dry mouth, phlegma in the morning. No coughing during the night.  RLS, pain in feet and hands, dysesthesias,  Epworth score  2  , Fatigue severity score 44  , geriatric  depression score 1 points, frustrated with her weight.   Ex-smoker, no surgery, no trauma to neck and throat.     History   Social History  . Marital Status: Married    Spouse Name: N/A    Number of Children: N/A  . Years of Education: N/A   Occupational History  . medical records clerk    Social History Main Topics  . Smoking status: Former Smoker -- 1.00 packs/day for 20 years    Quit date: 04/27/1996  . Smokeless tobacco: Not on file  . Alcohol Use: No  . Drug Use: No  . Sexual Activity: Not on file   Other Topics Concern  . Not on file   Social History Narrative   Right handed.  Married, 2 kids.  HS grad.  Caffeine 2 cups daily.    Family History  Problem Relation Age of Onset  . Lung cancer Father   . Bone cancer Father   .  Hypertension Sister   . Hypothyroidism Sister     Past Medical History  Diagnosis Date  . Chronic obstructive asthma   . Hyperlipidemia   . Hypertension   . Uterine fibroid   . Periodic limb movement   . Osteoporosis     Past Surgical History  Procedure Laterality Date  . Total abdominal hysterectomy  1998  . Bilateral salpingoophorectomy  1998  . Knee arthroscopy      left  . Appendectomy      Current Outpatient Prescriptions  Medication Sig Dispense Refill  . arformoterol (BROVANA) 15 MCG/2ML NEBU Take 15 mcg by nebulization 2 (two) times daily.    Marland Kitchen aspirin 81 MG tablet Take 81 mg by mouth daily.      . budesonide (PULMICORT) 0.5 MG/2ML nebulizer solution Take 0.5 mg by nebulization 2 (two) times daily.    . cholecalciferol (VITAMIN D) 1000 UNITS tablet Take 2,000 Units by mouth 2 (two) times daily.     .  cyanocobalamin 1000 MCG tablet Take 100 mcg by mouth daily.    Marland Kitchen ipratropium (ATROVENT) 0.02 % nebulizer solution Take 0.5 mg by nebulization 2 (two) times daily.    . rosuvastatin (CRESTOR) 10 MG tablet Take 20 mg by mouth daily.     . valsartan-hydrochlorothiazide (DIOVAN-HCT) 320-25 MG per tablet Take 1 tablet by mouth daily.      Marland Kitchen amLODipine (NORVASC) 5 MG tablet Take 5 mg by mouth daily.    . traMADol (ULTRAM) 50 MG tablet As needed     No current facility-administered medications for this visit.    Allergies as of 03/07/2014  . (No Known Allergies)    Vitals: BP 150/78 mmHg  Pulse 91  Temp(Src) 97.7 F (36.5 C) (Oral)  Resp 14  Ht 5' 6.25" (1.683 m)  Wt 306 lb 8 oz (139.027 kg)  BMI 49.08 kg/m2 Last Weight:  Wt Readings from Last 1 Encounters:  03/07/14 306 lb 8 oz (139.027 kg)       Last Height:   Ht Readings from Last 1 Encounters:  03/07/14 5' 6.25" (1.683 m)    Physical exam:  General: The patient is awake, alert and appears not in acute distress. The patient is well groomed. Head: Normocephalic, atraumatic. Neck is supple. Mallampati 4 ,  neck circumference 16. Nasal airflow unrestricted , TMJ is  Not evident . Retrognathia is seen.  Top dentures. Cardiovascular:  Regular rate and rhythm , without  murmurs or carotid bruit, and without distended neck veins. Respiratory: Lungs are clear to auscultation. Skin:  Without evidence of edema, or rash Trunk: BMI is significanty elevated , normal posture.  Neurologic exam : The patient is awake and alert, oriented to place and time.   Memory subjective  described as intact. There is a normal attention span & concentration ability. Speech is fluent without dysarthria, dysphonia or aphasia.  Mood and affect are appropriate.  Cranial nerves: Pupils are equal and briskly reactive to light. Funduscopic exam without evidence of pallor or edema. Extraocular movements  in vertical and horizontal planes intact and without  nystagmus. Visual fields by finger perimetry are intact. Hearing to finger rub intact.  Facial sensation intact to fine touch. Facial motor strength is symmetric and tongue and uvula move midline.  Motor exam:   Normal tone, bulk and symmetric strength in all extremities.  Sensory:  Fine touch, pinprick and vibration were tested in all extremities. Proprioception normal.  Numbness below the knee to all primary modalities,  Hands  feel cold all the time, feet feel cool all the time.   Coordination: Rapid alternating movements in the fingers/hands is normal.  Finger-to-nose maneuver  normal without evidence of ataxia, dysmetria or tremor.  Gait and station: Patient walks without assistive device and is able unassisted to climb up to the exam table. Strength within normal limits. Stance is stable and normal.  Deep tendon reflexes: in the  upper and lower extremities are symmetric and intact. Babinski maneuver response is equivocal .   Assessment:  After physical and neurologic examination, review of laboratory studies, imaging, neurophysiology testing and pre-existing records, assessment is   1)Morbid obesity, asthma/ COPD , hypoxemia at night - patient  is at high risk of hypoventilation.  Former smoker 20 pack years. May have overlap now with OSA - need split and CO2.  2)Neuropathy with increased fall risk , also no fall in 12 month. Has trouble walking on uneven surfaces.  3)RLS likely part of diabetic neuropathy.     The patient was advised of the nature of the diagnosed sleep disorder , the treatment options and risks for general a health and wellness arising from not treating the condition. Visit duration was 45 minutes.   Plan:  Treatment plan and additional workup :  Nasal pillow , please. patiet is apprehensive about SPLIT study, Score at 4 % and split at AHI 15.  Offer wedge , head of bed elevated , if needed.      Asencion Partridge Malaia Buchta MD  03/07/2014

## 2014-03-07 NOTE — Patient Instructions (Signed)
Sleep Apnea  Sleep apnea is a sleep disorder characterized by abnormal pauses in breathing while you sleep. When your breathing pauses, the level of oxygen in your blood decreases. This causes you to move out of deep sleep and into light sleep. As a result, your quality of sleep is poor, and the system that carries your blood throughout your body (cardiovascular system) experiences stress. If sleep apnea remains untreated, the following conditions can develop:  High blood pressure (hypertension).  Coronary artery disease.  Inability to achieve or maintain an erection (impotence).  Impairment of your thought process (cognitive dysfunction). There are three types of sleep apnea: 1. Obstructive sleep apnea--Pauses in breathing during sleep because of a blocked airway. 2. Central sleep apnea--Pauses in breathing during sleep because the area of the brain that controls your breathing does not send the correct signals to the muscles that control breathing. 3. Mixed sleep apnea--A combination of both obstructive and central sleep apnea. RISK FACTORS The following risk factors can increase your risk of developing sleep apnea:  Being overweight.  Smoking.  Having narrow passages in your nose and throat.  Being of older age.  Being female.  Alcohol use.  Sedative and tranquilizer use.  Ethnicity. Among individuals younger than 35 years, African Americans are at increased risk of sleep apnea. SYMPTOMS   Difficulty staying asleep.  Daytime sleepiness and fatigue.  Loss of energy.  Irritability.  Loud, heavy snoring.  Morning headaches.  Trouble concentrating.  Forgetfulness.  Decreased interest in sex. DIAGNOSIS  In order to diagnose sleep apnea, your caregiver will perform a physical examination. Your caregiver may suggest that you take a home sleep test. Your caregiver may also recommend that you spend the night in a sleep lab. In the sleep lab, several monitors record  information about your heart, lungs, and brain while you sleep. Your leg and arm movements and blood oxygen level are also recorded. TREATMENT The following actions may help to resolve mild sleep apnea:  Sleeping on your side.   Using a decongestant if you have nasal congestion.   Avoiding the use of depressants, including alcohol, sedatives, and narcotics.   Losing weight and modifying your diet if you are overweight. There also are devices and treatments to help open your airway:  Oral appliances. These are custom-made mouthpieces that shift your lower jaw forward and slightly open your bite. This opens your airway.  Devices that create positive airway pressure. This positive pressure "splints" your airway open to help you breathe better during sleep. The following devices create positive airway pressure:  Continuous positive airway pressure (CPAP) device. The CPAP device creates a continuous level of air pressure with an air pump. The air is delivered to your airway through a mask while you sleep. This continuous pressure keeps your airway open.  Nasal expiratory positive airway pressure (EPAP) device. The EPAP device creates positive air pressure as you exhale. The device consists of single-use valves, which are inserted into each nostril and held in place by adhesive. The valves create very little resistance when you inhale but create much more resistance when you exhale. That increased resistance creates the positive airway pressure. This positive pressure while you exhale keeps your airway open, making it easier to breath when you inhale again.  Bilevel positive airway pressure (BPAP) device. The BPAP device is used mainly in patients with central sleep apnea. This device is similar to the CPAP device because it also uses an air pump to deliver continuous air pressure   through a mask. However, with the BPAP machine, the pressure is set at two different levels. The pressure when you  exhale is lower than the pressure when you inhale.  Surgery. Typically, surgery is only done if you cannot comply with less invasive treatments or if the less invasive treatments do not improve your condition. Surgery involves removing excess tissue in your airway to create a wider passage way. Document Released: 04/03/2002 Document Revised: 08/08/2012 Document Reviewed: 08/20/2011 Exit[employer] Patient Information 2015 [employer]. This information is not intended to replace advice given to you by your health care provider. Make sure you discuss any questions you have with your health care provider.  

## 2014-03-08 LAB — SPECIMEN STATUS REPORT

## 2014-03-09 LAB — NEUROPATHY PANEL
A/G Ratio: 1.2 (ref 0.7–2.0)
ALPHA 2: 0.8 g/dL (ref 0.4–1.2)
ANGIO CONVERT ENZYME: 38 U/L (ref 14–82)
Albumin ELP: 3.6 g/dL (ref 3.2–5.6)
Alpha 1: 0.2 g/dL (ref 0.1–0.4)
Anti Nuclear Antibody(ANA): NEGATIVE
Beta: 1.2 g/dL (ref 0.6–1.3)
GLOBULIN, TOTAL: 3.1 g/dL (ref 2.0–4.5)
Gamma Globulin: 0.9 g/dL (ref 0.5–1.6)
Rhuematoid fact SerPl-aCnc: 13.6 IU/mL (ref 0.0–13.9)
Sed Rate: 25 mm/hr (ref 0–40)
TOTAL PROTEIN: 6.7 g/dL (ref 6.0–8.5)
TSH: 4.16 u[IU]/mL (ref 0.450–4.500)
VIT D 25 HYDROXY: 31.9 ng/mL (ref 30.0–100.0)
VITAMIN B 12: 687 pg/mL (ref 211–946)

## 2014-06-25 ENCOUNTER — Ambulatory Visit (INDEPENDENT_AMBULATORY_CARE_PROVIDER_SITE_OTHER): Payer: Medicare HMO | Admitting: Neurology

## 2014-06-25 VITALS — BP 141/75

## 2014-06-25 DIAGNOSIS — E662 Morbid (severe) obesity with alveolar hypoventilation: Secondary | ICD-10-CM

## 2014-06-25 DIAGNOSIS — Z9981 Dependence on supplemental oxygen: Secondary | ICD-10-CM

## 2014-06-25 DIAGNOSIS — E1340 Other specified diabetes mellitus with diabetic neuropathy, unspecified: Secondary | ICD-10-CM

## 2014-06-25 DIAGNOSIS — G4733 Obstructive sleep apnea (adult) (pediatric): Secondary | ICD-10-CM | POA: Diagnosis not present

## 2014-06-25 DIAGNOSIS — G2581 Restless legs syndrome: Secondary | ICD-10-CM

## 2014-06-25 NOTE — Sleep Study (Signed)
Please see the scanned sleep study interpretation located in the Procedure tab within the Chart Review section. 

## 2014-07-05 DIAGNOSIS — Z9981 Dependence on supplemental oxygen: Secondary | ICD-10-CM | POA: Insufficient documentation

## 2014-07-05 DIAGNOSIS — E662 Morbid (severe) obesity with alveolar hypoventilation: Secondary | ICD-10-CM | POA: Insufficient documentation

## 2014-07-06 ENCOUNTER — Telehealth: Payer: Self-pay | Admitting: *Deleted

## 2014-07-06 ENCOUNTER — Other Ambulatory Visit: Payer: Self-pay | Admitting: Neurology

## 2014-07-06 ENCOUNTER — Encounter: Payer: Self-pay | Admitting: Neurology

## 2014-07-06 DIAGNOSIS — R0902 Hypoxemia: Secondary | ICD-10-CM

## 2014-07-06 DIAGNOSIS — G4733 Obstructive sleep apnea (adult) (pediatric): Secondary | ICD-10-CM

## 2014-07-06 DIAGNOSIS — E662 Morbid (severe) obesity with alveolar hypoventilation: Secondary | ICD-10-CM

## 2014-07-06 NOTE — Telephone Encounter (Signed)
Patient was contacted and provided the results of her overnight sleep study that revealed both OSA and Hypoventilation.  Patient was advised that a 2nd study had been recommended in order to use CPAP therapy, and possibly the use of supplemental 02 in conjunction with PAP.  She was explained the process and agreed.  She scheduled her CPAP titration study for March 30th at 8:00 pm.  Dr. Crist Infante was notified of the results.

## 2014-07-25 ENCOUNTER — Ambulatory Visit (INDEPENDENT_AMBULATORY_CARE_PROVIDER_SITE_OTHER): Payer: Medicare HMO | Admitting: Neurology

## 2014-07-25 DIAGNOSIS — G4733 Obstructive sleep apnea (adult) (pediatric): Secondary | ICD-10-CM

## 2014-07-25 DIAGNOSIS — E662 Morbid (severe) obesity with alveolar hypoventilation: Secondary | ICD-10-CM

## 2014-07-25 DIAGNOSIS — R0902 Hypoxemia: Secondary | ICD-10-CM

## 2014-07-26 NOTE — Sleep Study (Signed)
Please see the scanned sleep study interpretation located in the Procedure tab within the Chart Review section. 

## 2014-07-31 DIAGNOSIS — R0902 Hypoxemia: Secondary | ICD-10-CM | POA: Insufficient documentation

## 2014-07-31 DIAGNOSIS — G4733 Obstructive sleep apnea (adult) (pediatric): Secondary | ICD-10-CM | POA: Insufficient documentation

## 2014-08-02 ENCOUNTER — Encounter: Payer: Self-pay | Admitting: Neurology

## 2014-08-08 ENCOUNTER — Telehealth: Payer: Self-pay | Admitting: *Deleted

## 2014-08-08 NOTE — Telephone Encounter (Signed)
I spoke with patient and she is aware of results.  She would like to proceed with CPAP. She would like for [name] to set this up with [facility] (she already has an account with them). She will also need to follow up with Dr. Brett Fairy.

## 2014-08-08 NOTE — Telephone Encounter (Signed)
Patient is calling office to get results of her most recent sleep study. (CPAP)

## 2014-08-09 ENCOUNTER — Other Ambulatory Visit: Payer: Self-pay | Admitting: Neurology

## 2014-08-09 DIAGNOSIS — G4733 Obstructive sleep apnea (adult) (pediatric): Secondary | ICD-10-CM

## 2014-08-10 ENCOUNTER — Telehealth: Payer: Self-pay | Admitting: *Deleted

## 2014-08-10 NOTE — Telephone Encounter (Signed)
Patient was called and left a VM informing her that a referral for CPAP supplies was sent to [place] per her request.

## 2014-08-17 ENCOUNTER — Encounter: Payer: Self-pay | Admitting: Neurology

## 2015-01-27 DIAGNOSIS — G4733 Obstructive sleep apnea (adult) (pediatric): Secondary | ICD-10-CM | POA: Diagnosis not present

## 2015-01-29 DIAGNOSIS — J449 Chronic obstructive pulmonary disease, unspecified: Secondary | ICD-10-CM | POA: Diagnosis not present

## 2015-01-29 DIAGNOSIS — I10 Essential (primary) hypertension: Secondary | ICD-10-CM | POA: Diagnosis not present

## 2015-01-29 DIAGNOSIS — J45998 Other asthma: Secondary | ICD-10-CM | POA: Diagnosis not present

## 2015-01-29 DIAGNOSIS — G4733 Obstructive sleep apnea (adult) (pediatric): Secondary | ICD-10-CM | POA: Diagnosis not present

## 2015-01-30 DIAGNOSIS — I10 Essential (primary) hypertension: Secondary | ICD-10-CM | POA: Diagnosis not present

## 2015-01-30 DIAGNOSIS — J449 Chronic obstructive pulmonary disease, unspecified: Secondary | ICD-10-CM | POA: Diagnosis not present

## 2015-01-30 DIAGNOSIS — G4733 Obstructive sleep apnea (adult) (pediatric): Secondary | ICD-10-CM | POA: Diagnosis not present

## 2015-02-05 DIAGNOSIS — Z1212 Encounter for screening for malignant neoplasm of rectum: Secondary | ICD-10-CM | POA: Diagnosis not present

## 2015-02-08 DIAGNOSIS — J45909 Unspecified asthma, uncomplicated: Secondary | ICD-10-CM | POA: Diagnosis not present

## 2015-02-08 DIAGNOSIS — I10 Essential (primary) hypertension: Secondary | ICD-10-CM | POA: Diagnosis not present

## 2015-02-08 DIAGNOSIS — R0609 Other forms of dyspnea: Secondary | ICD-10-CM | POA: Diagnosis not present

## 2015-02-08 DIAGNOSIS — Z6841 Body Mass Index (BMI) 40.0 and over, adult: Secondary | ICD-10-CM | POA: Diagnosis not present

## 2015-02-08 DIAGNOSIS — E119 Type 2 diabetes mellitus without complications: Secondary | ICD-10-CM | POA: Diagnosis not present

## 2015-02-12 DIAGNOSIS — L84 Corns and callosities: Secondary | ICD-10-CM | POA: Diagnosis not present

## 2015-02-12 DIAGNOSIS — L600 Ingrowing nail: Secondary | ICD-10-CM | POA: Diagnosis not present

## 2015-02-12 DIAGNOSIS — E1149 Type 2 diabetes mellitus with other diabetic neurological complication: Secondary | ICD-10-CM | POA: Diagnosis not present

## 2015-02-12 DIAGNOSIS — B351 Tinea unguium: Secondary | ICD-10-CM | POA: Diagnosis not present

## 2015-02-26 DIAGNOSIS — J449 Chronic obstructive pulmonary disease, unspecified: Secondary | ICD-10-CM | POA: Diagnosis not present

## 2015-02-26 DIAGNOSIS — G4733 Obstructive sleep apnea (adult) (pediatric): Secondary | ICD-10-CM | POA: Diagnosis not present

## 2015-02-26 DIAGNOSIS — J45998 Other asthma: Secondary | ICD-10-CM | POA: Diagnosis not present

## 2015-02-26 DIAGNOSIS — I10 Essential (primary) hypertension: Secondary | ICD-10-CM | POA: Diagnosis not present

## 2015-02-27 DIAGNOSIS — G4733 Obstructive sleep apnea (adult) (pediatric): Secondary | ICD-10-CM | POA: Diagnosis not present

## 2015-03-02 DIAGNOSIS — J449 Chronic obstructive pulmonary disease, unspecified: Secondary | ICD-10-CM | POA: Diagnosis not present

## 2015-03-02 DIAGNOSIS — I10 Essential (primary) hypertension: Secondary | ICD-10-CM | POA: Diagnosis not present

## 2015-03-02 DIAGNOSIS — G4733 Obstructive sleep apnea (adult) (pediatric): Secondary | ICD-10-CM | POA: Diagnosis not present

## 2015-03-28 DIAGNOSIS — I10 Essential (primary) hypertension: Secondary | ICD-10-CM | POA: Diagnosis not present

## 2015-03-28 DIAGNOSIS — J45998 Other asthma: Secondary | ICD-10-CM | POA: Diagnosis not present

## 2015-03-28 DIAGNOSIS — J449 Chronic obstructive pulmonary disease, unspecified: Secondary | ICD-10-CM | POA: Diagnosis not present

## 2015-03-28 DIAGNOSIS — G4733 Obstructive sleep apnea (adult) (pediatric): Secondary | ICD-10-CM | POA: Diagnosis not present

## 2015-03-29 DIAGNOSIS — G4733 Obstructive sleep apnea (adult) (pediatric): Secondary | ICD-10-CM | POA: Diagnosis not present

## 2015-04-01 DIAGNOSIS — G4733 Obstructive sleep apnea (adult) (pediatric): Secondary | ICD-10-CM | POA: Diagnosis not present

## 2015-04-01 DIAGNOSIS — J449 Chronic obstructive pulmonary disease, unspecified: Secondary | ICD-10-CM | POA: Diagnosis not present

## 2015-04-01 DIAGNOSIS — I10 Essential (primary) hypertension: Secondary | ICD-10-CM | POA: Diagnosis not present

## 2015-04-29 DIAGNOSIS — G4733 Obstructive sleep apnea (adult) (pediatric): Secondary | ICD-10-CM | POA: Diagnosis not present

## 2015-04-30 DIAGNOSIS — J449 Chronic obstructive pulmonary disease, unspecified: Secondary | ICD-10-CM | POA: Diagnosis not present

## 2015-04-30 DIAGNOSIS — J45998 Other asthma: Secondary | ICD-10-CM | POA: Diagnosis not present

## 2015-04-30 DIAGNOSIS — I10 Essential (primary) hypertension: Secondary | ICD-10-CM | POA: Diagnosis not present

## 2015-04-30 DIAGNOSIS — G4733 Obstructive sleep apnea (adult) (pediatric): Secondary | ICD-10-CM | POA: Diagnosis not present

## 2015-05-02 DIAGNOSIS — G4733 Obstructive sleep apnea (adult) (pediatric): Secondary | ICD-10-CM | POA: Diagnosis not present

## 2015-05-02 DIAGNOSIS — J449 Chronic obstructive pulmonary disease, unspecified: Secondary | ICD-10-CM | POA: Diagnosis not present

## 2015-05-02 DIAGNOSIS — I10 Essential (primary) hypertension: Secondary | ICD-10-CM | POA: Diagnosis not present

## 2015-05-14 DIAGNOSIS — B351 Tinea unguium: Secondary | ICD-10-CM | POA: Diagnosis not present

## 2015-05-14 DIAGNOSIS — E1149 Type 2 diabetes mellitus with other diabetic neurological complication: Secondary | ICD-10-CM | POA: Diagnosis not present

## 2015-05-27 DIAGNOSIS — I10 Essential (primary) hypertension: Secondary | ICD-10-CM | POA: Diagnosis not present

## 2015-05-27 DIAGNOSIS — J45998 Other asthma: Secondary | ICD-10-CM | POA: Diagnosis not present

## 2015-05-27 DIAGNOSIS — J449 Chronic obstructive pulmonary disease, unspecified: Secondary | ICD-10-CM | POA: Diagnosis not present

## 2015-05-27 DIAGNOSIS — G4733 Obstructive sleep apnea (adult) (pediatric): Secondary | ICD-10-CM | POA: Diagnosis not present

## 2015-05-30 DIAGNOSIS — G4733 Obstructive sleep apnea (adult) (pediatric): Secondary | ICD-10-CM | POA: Diagnosis not present

## 2015-06-02 ENCOUNTER — Inpatient Hospital Stay (HOSPITAL_BASED_OUTPATIENT_CLINIC_OR_DEPARTMENT_OTHER)
Admission: EM | Admit: 2015-06-02 | Discharge: 2015-06-04 | DRG: 190 | Disposition: A | Discharge status: Home / Self Care | Payer: Medicare HMO | Attending: Internal Medicine | Admitting: Internal Medicine

## 2015-06-02 ENCOUNTER — Encounter (HOSPITAL_BASED_OUTPATIENT_CLINIC_OR_DEPARTMENT_OTHER): Payer: Self-pay | Admitting: *Deleted

## 2015-06-02 ENCOUNTER — Emergency Department (HOSPITAL_BASED_OUTPATIENT_CLINIC_OR_DEPARTMENT_OTHER): Payer: Medicare HMO

## 2015-06-02 DIAGNOSIS — J449 Chronic obstructive pulmonary disease, unspecified: Secondary | ICD-10-CM | POA: Diagnosis not present

## 2015-06-02 DIAGNOSIS — M79606 Pain in leg, unspecified: Secondary | ICD-10-CM | POA: Diagnosis not present

## 2015-06-02 DIAGNOSIS — M79605 Pain in left leg: Secondary | ICD-10-CM | POA: Diagnosis not present

## 2015-06-02 DIAGNOSIS — T380X5A Adverse effect of glucocorticoids and synthetic analogues, initial encounter: Secondary | ICD-10-CM | POA: Diagnosis present

## 2015-06-02 DIAGNOSIS — I10 Essential (primary) hypertension: Secondary | ICD-10-CM | POA: Diagnosis not present

## 2015-06-02 DIAGNOSIS — R0602 Shortness of breath: Secondary | ICD-10-CM

## 2015-06-02 DIAGNOSIS — R0600 Dyspnea, unspecified: Secondary | ICD-10-CM | POA: Diagnosis present

## 2015-06-02 DIAGNOSIS — R05 Cough: Secondary | ICD-10-CM | POA: Diagnosis not present

## 2015-06-02 DIAGNOSIS — Z87891 Personal history of nicotine dependence: Secondary | ICD-10-CM

## 2015-06-02 DIAGNOSIS — Z8249 Family history of ischemic heart disease and other diseases of the circulatory system: Secondary | ICD-10-CM

## 2015-06-02 DIAGNOSIS — E785 Hyperlipidemia, unspecified: Secondary | ICD-10-CM | POA: Diagnosis present

## 2015-06-02 DIAGNOSIS — G629 Polyneuropathy, unspecified: Secondary | ICD-10-CM | POA: Diagnosis present

## 2015-06-02 DIAGNOSIS — J9601 Acute respiratory failure with hypoxia: Secondary | ICD-10-CM

## 2015-06-02 DIAGNOSIS — Z9989 Dependence on other enabling machines and devices: Secondary | ICD-10-CM | POA: Diagnosis present

## 2015-06-02 DIAGNOSIS — G4733 Obstructive sleep apnea (adult) (pediatric): Secondary | ICD-10-CM | POA: Diagnosis present

## 2015-06-02 DIAGNOSIS — E662 Morbid (severe) obesity with alveolar hypoventilation: Secondary | ICD-10-CM | POA: Diagnosis present

## 2015-06-02 DIAGNOSIS — J45909 Unspecified asthma, uncomplicated: Secondary | ICD-10-CM | POA: Diagnosis not present

## 2015-06-02 DIAGNOSIS — J441 Chronic obstructive pulmonary disease with (acute) exacerbation: Secondary | ICD-10-CM | POA: Diagnosis not present

## 2015-06-02 DIAGNOSIS — Z7951 Long term (current) use of inhaled steroids: Secondary | ICD-10-CM

## 2015-06-02 DIAGNOSIS — Z7982 Long term (current) use of aspirin: Secondary | ICD-10-CM

## 2015-06-02 DIAGNOSIS — M810 Age-related osteoporosis without current pathological fracture: Secondary | ICD-10-CM | POA: Diagnosis not present

## 2015-06-02 DIAGNOSIS — Z808 Family history of malignant neoplasm of other organs or systems: Secondary | ICD-10-CM

## 2015-06-02 DIAGNOSIS — Z90710 Acquired absence of both cervix and uterus: Secondary | ICD-10-CM | POA: Diagnosis not present

## 2015-06-02 DIAGNOSIS — J069 Acute upper respiratory infection, unspecified: Secondary | ICD-10-CM | POA: Diagnosis not present

## 2015-06-02 DIAGNOSIS — R0902 Hypoxemia: Secondary | ICD-10-CM | POA: Diagnosis not present

## 2015-06-02 DIAGNOSIS — R739 Hyperglycemia, unspecified: Secondary | ICD-10-CM | POA: Diagnosis present

## 2015-06-02 DIAGNOSIS — Z801 Family history of malignant neoplasm of trachea, bronchus and lung: Secondary | ICD-10-CM

## 2015-06-02 DIAGNOSIS — R600 Localized edema: Secondary | ICD-10-CM | POA: Diagnosis present

## 2015-06-02 DIAGNOSIS — Z6841 Body Mass Index (BMI) 40.0 and over, adult: Secondary | ICD-10-CM | POA: Diagnosis not present

## 2015-06-02 DIAGNOSIS — Z79899 Other long term (current) drug therapy: Secondary | ICD-10-CM

## 2015-06-02 DIAGNOSIS — J9600 Acute respiratory failure, unspecified whether with hypoxia or hypercapnia: Secondary | ICD-10-CM | POA: Diagnosis present

## 2015-06-02 HISTORY — DX: Sleep apnea, unspecified: G47.30

## 2015-06-02 HISTORY — DX: Emphysema, unspecified: J43.9

## 2015-06-02 LAB — BASIC METABOLIC PANEL
Anion gap: 9 (ref 5–15)
BUN: 14 mg/dL (ref 6–20)
CALCIUM: 9.1 mg/dL (ref 8.9–10.3)
CO2: 31 mmol/L (ref 22–32)
CREATININE: 0.69 mg/dL (ref 0.44–1.00)
Chloride: 100 mmol/L — ABNORMAL LOW (ref 101–111)
Glucose, Bld: 115 mg/dL — ABNORMAL HIGH (ref 65–99)
Potassium: 3.8 mmol/L (ref 3.5–5.1)
SODIUM: 140 mmol/L (ref 135–145)

## 2015-06-02 LAB — CBC WITH DIFFERENTIAL/PLATELET
BASOS ABS: 0 10*3/uL (ref 0.0–0.1)
BASOS PCT: 0 %
EOS ABS: 0.3 10*3/uL (ref 0.0–0.7)
Eosinophils Relative: 3 %
HCT: 44.4 % (ref 36.0–46.0)
HEMOGLOBIN: 14.5 g/dL (ref 12.0–15.0)
Lymphocytes Relative: 18 %
Lymphs Abs: 1.8 10*3/uL (ref 0.7–4.0)
MCH: 31.5 pg (ref 26.0–34.0)
MCHC: 32.7 g/dL (ref 30.0–36.0)
MCV: 96.3 fL (ref 78.0–100.0)
MONOS PCT: 7 %
Monocytes Absolute: 0.7 10*3/uL (ref 0.1–1.0)
NEUTROS PCT: 72 %
Neutro Abs: 7 10*3/uL (ref 1.7–7.7)
Platelets: 304 10*3/uL (ref 150–400)
RBC: 4.61 MIL/uL (ref 3.87–5.11)
RDW: 13.7 % (ref 11.5–15.5)
WBC: 9.7 10*3/uL (ref 4.0–10.5)

## 2015-06-02 MED ORDER — SODIUM CHLORIDE 0.9 % IV SOLN: 250.0000 mL | INTRAVENOUS | Status: DC | PRN

## 2015-06-02 MED ORDER — LEVOFLOXACIN IN D5W 750 MG/150ML IV SOLN
750.0000 mg | Freq: Every day | INTRAVENOUS | Status: DC
  Administered 2015-06-03 (×2): 750 mg via INTRAVENOUS
  Filled 2015-06-02 (×2): qty 150

## 2015-06-02 MED ORDER — HYDROCODONE-ACETAMINOPHEN 5-325 MG PO TABS: 1.0000 | ORAL_TABLET | ORAL | Status: DC | PRN

## 2015-06-02 MED ORDER — ONDANSETRON HCL 4 MG/2ML IJ SOLN
4.0000 mg | Freq: Four times a day (QID) | INTRAMUSCULAR | Status: DC | PRN
Start: 2015-06-02 — End: 2015-06-04

## 2015-06-02 MED ORDER — METHYLPREDNISOLONE SODIUM SUCC 125 MG IJ SOLR
60.0000 mg | Freq: Four times a day (QID) | INTRAMUSCULAR | Status: DC
  Administered 2015-06-03 – 2015-06-04 (×7): 60 mg via INTRAVENOUS
  Filled 2015-06-02 (×8): qty 2

## 2015-06-02 MED ORDER — VITAMIN D 1000 UNITS PO TABS
2000.0000 [IU] | ORAL_TABLET | Freq: Two times a day (BID) | ORAL | Status: DC
  Administered 2015-06-03 – 2015-06-04 (×3): 2000 [IU] via ORAL
  Filled 2015-06-02 (×3): qty 2

## 2015-06-02 MED ORDER — VITAMIN B-12 100 MCG PO TABS
1000.0000 ug | ORAL_TABLET | Freq: Every day | ORAL | Status: DC
  Administered 2015-06-03: 1000 ug via ORAL
  Filled 2015-06-02: qty 10

## 2015-06-02 MED ORDER — IPRATROPIUM-ALBUTEROL 0.5-2.5 (3) MG/3ML IN SOLN
3.0000 mL | Freq: Once | RESPIRATORY_TRACT | Status: AC
  Administered 2015-06-02: 3 mL via RESPIRATORY_TRACT
  Filled 2015-06-02: qty 3

## 2015-06-02 MED ORDER — METHYLPREDNISOLONE SODIUM SUCC 125 MG IJ SOLR
125.0000 mg | Freq: Once | INTRAMUSCULAR | Status: AC
  Administered 2015-06-02: 125 mg via INTRAVENOUS
  Filled 2015-06-02: qty 2

## 2015-06-02 MED ORDER — AMLODIPINE BESYLATE 5 MG PO TABS
5.0000 mg | ORAL_TABLET | Freq: Every day | ORAL | Status: DC
  Administered 2015-06-03 – 2015-06-04 (×2): 5 mg via ORAL
  Filled 2015-06-02 (×2): qty 1

## 2015-06-02 MED ORDER — GABAPENTIN 100 MG PO CAPS
200.0000 mg | ORAL_CAPSULE | Freq: Once | ORAL | Status: AC
  Administered 2015-06-02: 200 mg via ORAL

## 2015-06-02 MED ORDER — GABAPENTIN 100 MG PO CAPS
ORAL_CAPSULE | ORAL | Status: AC
  Filled 2015-06-02: qty 2

## 2015-06-02 MED ORDER — BISACODYL 5 MG PO TBEC: 5.0000 mg | DELAYED_RELEASE_TABLET | Freq: Every day | ORAL | Status: DC | PRN

## 2015-06-02 MED ORDER — VALSARTAN-HYDROCHLOROTHIAZIDE 320-25 MG PO TABS: 1.0000 | ORAL_TABLET | Freq: Every day | ORAL | Status: DC

## 2015-06-02 MED ORDER — GABAPENTIN 100 MG PO CAPS
200.0000 mg | ORAL_CAPSULE | Freq: Three times a day (TID) | ORAL | Status: DC
  Administered 2015-06-03 – 2015-06-04 (×4): 200 mg via ORAL
  Filled 2015-06-02 (×4): qty 2

## 2015-06-02 MED ORDER — CEFTRIAXONE SODIUM 1 G IJ SOLR
1.0000 g | Freq: Once | INTRAMUSCULAR | Status: AC
  Administered 2015-06-02: 1 g via INTRAVENOUS
  Filled 2015-06-02: qty 10

## 2015-06-02 MED ORDER — ALBUTEROL SULFATE (2.5 MG/3ML) 0.083% IN NEBU
2.5000 mg | INHALATION_SOLUTION | Freq: Once | RESPIRATORY_TRACT | Status: AC
  Administered 2015-06-02: 2.5 mg via RESPIRATORY_TRACT
  Filled 2015-06-02: qty 3

## 2015-06-02 MED ORDER — TRAMADOL HCL 50 MG PO TABS: 50.0000 mg | ORAL_TABLET | Freq: Every evening | ORAL | Status: DC | PRN

## 2015-06-02 MED ORDER — ASPIRIN EC 81 MG PO TBEC
81.0000 mg | DELAYED_RELEASE_TABLET | Freq: Every day | ORAL | Status: DC
  Administered 2015-06-03 – 2015-06-04 (×2): 81 mg via ORAL
  Filled 2015-06-02 (×2): qty 1

## 2015-06-02 MED ORDER — ACETAMINOPHEN 325 MG PO TABS: 650.0000 mg | ORAL_TABLET | Freq: Four times a day (QID) | ORAL | Status: DC | PRN

## 2015-06-02 MED ORDER — ALBUTEROL (5 MG/ML) CONTINUOUS INHALATION SOLN
10.0000 mg/h | INHALATION_SOLUTION | RESPIRATORY_TRACT | Status: AC
  Administered 2015-06-02: 10 mg/h via RESPIRATORY_TRACT
  Filled 2015-06-02: qty 20

## 2015-06-02 MED ORDER — SODIUM CHLORIDE 0.9% FLUSH: 3.0000 mL | INTRAVENOUS | Status: DC | PRN

## 2015-06-02 MED ORDER — SENNOSIDES-DOCUSATE SODIUM 8.6-50 MG PO TABS: 1.0000 | ORAL_TABLET | Freq: Every evening | ORAL | Status: DC | PRN

## 2015-06-02 MED ORDER — BUDESONIDE 0.5 MG/2ML IN SUSP
0.5000 mg | Freq: Two times a day (BID) | RESPIRATORY_TRACT | Status: DC
  Administered 2015-06-03 – 2015-06-04 (×3): 0.5 mg via RESPIRATORY_TRACT
  Filled 2015-06-02 (×3): qty 2

## 2015-06-02 MED ORDER — ONDANSETRON HCL 4 MG PO TABS: 4.0000 mg | ORAL_TABLET | Freq: Four times a day (QID) | ORAL | Status: DC | PRN

## 2015-06-02 MED ORDER — ACETAMINOPHEN 650 MG RE SUPP: 650.0000 mg | Freq: Four times a day (QID) | RECTAL | Status: DC | PRN

## 2015-06-02 MED ORDER — SODIUM CHLORIDE 0.9% FLUSH
3.0000 mL | Freq: Two times a day (BID) | INTRAVENOUS | Status: DC
  Administered 2015-06-03: 3 mL via INTRAVENOUS

## 2015-06-02 MED ORDER — IPRATROPIUM-ALBUTEROL 0.5-2.5 (3) MG/3ML IN SOLN
3.0000 mL | RESPIRATORY_TRACT | Status: DC | PRN
  Administered 2015-06-03: 3 mL via RESPIRATORY_TRACT
  Filled 2015-06-02: qty 3

## 2015-06-02 MED ORDER — SODIUM CHLORIDE 0.9% FLUSH
3.0000 mL | Freq: Two times a day (BID) | INTRAVENOUS | Status: DC
  Administered 2015-06-03 – 2015-06-04 (×4): 3 mL via INTRAVENOUS

## 2015-06-02 MED ORDER — ROSUVASTATIN CALCIUM 20 MG PO TABS
20.0000 mg | ORAL_TABLET | Freq: Every day | ORAL | Status: DC
  Administered 2015-06-03 – 2015-06-04 (×2): 20 mg via ORAL
  Filled 2015-06-02 (×2): qty 1

## 2015-06-02 MED ORDER — IPRATROPIUM-ALBUTEROL 0.5-2.5 (3) MG/3ML IN SOLN
3.0000 mL | Freq: Four times a day (QID) | RESPIRATORY_TRACT | Status: DC
  Administered 2015-06-03 – 2015-06-04 (×6): 3 mL via RESPIRATORY_TRACT
  Filled 2015-06-02 (×6): qty 3

## 2015-06-02 MED ORDER — ENOXAPARIN SODIUM 40 MG/0.4ML ~~LOC~~ SOLN
40.0000 mg | Freq: Every day | SUBCUTANEOUS | Status: DC
  Administered 2015-06-03 – 2015-06-04 (×2): 40 mg via SUBCUTANEOUS
  Filled 2015-06-02 (×2): qty 0.4

## 2015-06-02 NOTE — ED Notes (Signed)
RT at bedside to assess. O2 sats 93% on 3L

## 2015-06-02 NOTE — ED Notes (Signed)
Uses CPAP at night, auto titration

## 2015-06-02 NOTE — ED Notes (Signed)
Pt states since Tuesday has had a prod cough of thick green sec, no fever, no chest pain

## 2015-06-02 NOTE — H&P (Signed)
[facility] History and Physical  Cassandra Hart [id number] DOB: [birth year] DOA: 06/02/2015  Referring physician: ED physician PCP: Jerlyn Ly, MD  Specialists: none   Chief Complaint:  Dyspnea   HPI: Cassandra Hart is a 70 y.o. female with PMH of hypertension, hyperlipidemia, COPD, and OSA/OHS on CPAP with 2 L/m supplemental oxygen at night who presents in transfer from [facility] where she presented with dyspnea on minimal exertion. She had been in her usual state of health until approximately 5 days ago when she developed a slight increase in her chronic dyspnea which was accompanied by subjective fevers. She saw her PCP for these complaints and was prescribed a Z-Pak. Despite this therapy, symptoms progressed over the ensuing days until she was dyspneic with minimal exertion, or even with speech, prompting her ED visit. She has been treated for mild COPD exacerbation as an outpatient in the past, but denies any prior hospitalizations for respiratory problem. She is a former smoker, reporting abstinence for many years now. She denies long distance travel, lower extremity swelling or tenderness, palpitations, pleuritic pain, or hemoptysis. She did have subjective fevers yesterday and the day prior, but none today. She reports that her husband had been febrile with an upper respiratory illness from which she is just now recovering. She reports increase in her chronic cough, but it remains largely non-productive.   Upon her arrival to the outside hospital, she was found to be afebrile, but requiring 2-3 L/m of supplemental oxygen to maintain saturations in the 90s. Chest x-ray was negative for acute cardiopulmonary disease and EKG demonstrates normal sinus rhythm. Blood work, including CBC and chemistries were largely unremarkable and well the patient enjoyed some transient improvement with 125 mg IV push of Solu-Medrol, DuoNeb, and an empiric dose of Rocephin, she continued to  require supplemental oxygen to maintain adequate O2 saturations. Transfer to [facility] for admission was requested and she was accepted to a telemetry bed.  Patient was evaluated in the telemetry unit shortly after arrival and is noted to be afebrile but in moderate respiratory distress with tachypnea and accessory muscle recruitment. She appears to be in a normal sinus rhythm on the monitor and denies chest pain or palpitations. Nasal cannula is in place delivering 3 L/m supplemental oxygen and there is no pallor or cyanosis. Plan of care was discussed at the bedside with the patient, her daughter, and her husband, and she will be admitted to the telemetry unit for ongoing evaluation and management of COPD with acute exacerbation suspected secondary to an infectious precipitant.  Where does patient live?   At home  Can patient participate in ADLs?  Yes      Review of Systems:   General: no sweats, weight change, poor appetite, or fatigue. Subjective fever and chills for 2 days prior to admission.  HEENT: no blurry vision, hearing changes or sore throat Pulm: Dyspnea, non-productive cough, wheezing  CV: no chest pain or palpitations Abd: no nausea, vomiting, abdominal pain, diarrhea, or constipation GU: no dysuria, hematuria, increased urinary frequency, or urgency  Ext: no leg edema Neuro: no focal weakness, numbness, or tingling, no vision change or hearing loss Skin: no rash, no wounds MSK: No muscle spasm, no deformity, no red, hot, or swollen joint Heme: No easy bruising or bleeding Travel history: No recent long distant travel    Allergy: No Known Allergies  Past Medical History  Diagnosis Date  . Chronic obstructive asthma   . Hyperlipidemia   .  Hypertension   . Uterine fibroid   . Periodic limb movement   . Osteoporosis   . Emphysema lung ([place])   . Sleep apnea     using CPAP    Past Surgical History  Procedure Laterality Date  . Total abdominal hysterectomy  1998  .  Bilateral salpingoophorectomy  1998  . Knee arthroscopy      left  . Appendectomy      Social History:  reports that she quit smoking about 19 years ago. She does not have any smokeless tobacco history on file. She reports that she does not drink alcohol or use illicit drugs.  Family History:  Family History  Problem Relation Age of Onset  . Lung cancer Father   . Bone cancer Father   . Hypertension Sister   . Hypothyroidism Sister      Prior to Admission medications   Medication Sig Start Date End Date Taking? Authorizing Provider  amLODipine (NORVASC) 5 MG tablet Take 5 mg by mouth daily.   Yes Historical Provider, MD  arformoterol (BROVANA) 15 MCG/2ML NEBU Take 15 mcg by nebulization 2 (two) times daily.   Yes Historical Provider, MD  aspirin 81 MG tablet Take 81 mg by mouth daily.     Yes Historical Provider, MD  budesonide (PULMICORT) 0.5 MG/2ML nebulizer solution Take 0.5 mg by nebulization 2 (two) times daily.   Yes Historical Provider, MD  cholecalciferol (VITAMIN D) 1000 UNITS tablet Take 2,000 Units by mouth 2 (two) times daily.    Yes Historical Provider, MD  cyanocobalamin 1000 MCG tablet Take 100 mcg by mouth daily.   Yes Historical Provider, MD  gabapentin (NEURONTIN) 100 MG capsule Take 200 mg by mouth 3 (three) times daily.   Yes Historical Provider, MD  ipratropium (ATROVENT) 0.02 % nebulizer solution Take 0.5 mg by nebulization 2 (two) times daily.   Yes Historical Provider, MD  rosuvastatin (CRESTOR) 10 MG tablet Take 20 mg by mouth daily.    Yes Historical Provider, MD  traMADol (ULTRAM) 50 MG tablet Take 1 tablet (50 mg total) by mouth Nightly. As needed 03/07/14  Yes Larey Seat, MD  valsartan-hydrochlorothiazide (DIOVAN-HCT) 320-25 MG per tablet Take 1 tablet by mouth daily.     Yes Historical Provider, MD    Physical Exam: Filed Vitals:   06/02/15 2000 06/02/15 2030 06/02/15 2100 06/02/15 2221  BP: 137/72 143/70 138/62 152/70  Pulse: 89 86 87   Temp:     98.4 F (36.9 C)  TempSrc:    Oral  Resp: 21 19 24 24   Height:    5\' 5"  (1.651 m)  Weight:    132.1 kg (291 lb 3.6 oz)  SpO2: 90% 89% 90% 95%   General: In moderate respiratory distress with tachypnea, increased WOB, and gasping between short sentences  HEENT:       Eyes: PERRL, EOMI, no scleral icterus or conjunctival pallor.       ENT: No discharge from the ears or nose, no pharyngeal ulcers, petechiae or exudate, no tonsillar enlargement.        Neck: No JVD, no bruit, no appreciable mass Heme: No cervical adenopathy, no pallor Cardiac: S1/S2, RRR, No murmurs, No gallops or rubs. Pulm: Symmetric chest wall rise, no pallor or cyanosis. Breath sounds diminished b/l with prolonged expiratory phase and end-expiratory wheeze.  Abd: Soft, nondistended, nontender, no rebound pain or gaurding, no mass or organomegaly, BS present. Ext: No LE edema bilaterally. 2+DP/PT pulse bilaterally. Musculoskeletal: No gross deformity, no  red, hot, swollen joints, no limitation in ROM  Skin: No rashes or wounds on exposed surfaces  Neuro: Alert, oriented X3, cranial nerves II-XII grossly intact. No focal findings Psych: Patient is not overtly psychotic, appropriate mood and affect.  Labs on Admission:  Basic Metabolic Panel:  Recent Labs Lab 06/02/15 1655  NA 140  K 3.8  CL 100*  CO2 31  GLUCOSE 115*  BUN 14  CREATININE 0.69  CALCIUM 9.1   Liver Function Tests: No results for input(s): AST, ALT, ALKPHOS, BILITOT, PROT, ALBUMIN in the last 168 hours. No results for input(s): LIPASE, AMYLASE in the last 168 hours. No results for input(s): AMMONIA in the last 168 hours. CBC:  Recent Labs Lab 06/02/15 1655  WBC 9.7  NEUTROABS 7.0  HGB 14.5  HCT 44.4  MCV 96.3  PLT 304   Cardiac Enzymes: No results for input(s): CKTOTAL, CKMB, CKMBINDEX, TROPONINI in the last 168 hours.  BNP (last 3 results) No results for input(s): BNP in the last 8760 hours.  ProBNP (last 3 results) No results  for input(s): PROBNP in the last 8760 hours.  CBG: No results for input(s): GLUCAP in the last 168 hours.  Radiological Exams on Admission: Dg Chest Port 1 View  06/02/2015  CLINICAL DATA:  70 year old female with shortness of breath for 4 days and productive cough. Former smoker. Initial encounter. EXAM: PORTABLE CHEST 1 VIEW COMPARISON:  03/13/2013 FINDINGS: Portable AP semi upright view at 1638 hours. A degree of chronic pulmonary hyperinflation is suspected. Stable cardiac size and mediastinal contours. Visualized tracheal air column is within normal limits. Allowing for portable technique, the lungs are clear. No pneumothorax identified. IMPRESSION: No acute cardiopulmonary abnormality. Electronically Signed   By: Genevie Ann M.D.   On: 06/02/2015 16:56    EKG: Independently reviewed.  Abnormal findings:   Sinus rhythm, low-voltage QRS, non-specific repolarization abnormality   Assessment/Plan  1. COPD with acute exacerbation and acute hypoxic respiratory failure  - Suspect infectious precipitant given preceding fevers  - DuoNeb scheduled q6h; available q2h prn  - Solu-Medrol 60 mg q6h  - Levaquin 750mg  q24h  - Monitor on continuous pulse oximetry with titration of FiO2 to maintain sats low 90s  - Continue Pulmicort    2. OSA/OHS - CPAP qHS with 2 Lpm supplemental O2, or more as needed to maintain sat low 90s  - Wt loss encouraged    3. HLD  - Continue Crestor   4. Essential HTN  - Continue home regimen with Norvasc and Diovan  - Adjust treatment as needed    5. Peripheral neuropathy   - Uncertain etiology given absence of DM or alcohol abuse  - Continue home-dose gabapentin      DVT ppx:  SQ Lovenox      Code Status: Full code Family Communication:  Yes, patient's daughter and husband at bed side Disposition Plan: Admit to inpatient   Date of Service 06/02/2015    Vianne Bulls, MD [facility] Pager [phone]  If 7PM-7AM, please contact  night-coverage [email or web address] Password [email or web address] 06/02/2015, 11:54 PM

## 2015-06-02 NOTE — ED Notes (Signed)
Pt alert, NAD, calm, interactive, resps e/ mildly labored, some increased wob present, sitting on edge of bed for comfort, family at [facility], denies pain, "feel better", VSS.

## 2015-06-02 NOTE — ED Notes (Signed)
Sick since Tuesday, has had zpack- increased SOB- O2 sats 86% by nursefirst

## 2015-06-02 NOTE — ED Notes (Signed)
MD at bedside. 

## 2015-06-02 NOTE — ED Notes (Signed)
ECG to EDP for review 

## 2015-06-02 NOTE — ED Notes (Signed)
[facility] here for transport, no changes, VSS.

## 2015-06-02 NOTE — ED Provider Notes (Signed)
CSN: [id number]     Arrival date & time 06/02/15  1613 History  By signing my name below, I, Altamease Oiler, attest that this documentation has been prepared under the direction and in the presence of Davonna Belling, MD. Electronically Signed: Altamease Oiler, ED Scribe. 06/02/2015. 6:17 PM   Chief Complaint  Patient presents with  . Shortness of Breath   The history is provided by the patient. No language interpreter was used.   Cassandra Hart is a 70 y.o. female with history of emphysema, asthma, HTN, and HLD who presents to the Emergency Department complaining of worsening SOB with gradual onset 5 days ago. Her breathing is worse with exertion. Associated symptoms include productive cough and fever up to 100. Denies nausea, vomiting, chest pain, and head congestion. Pt was seen by her PCP 5 days ago and prescribed z-pak that has not provided any relief. No recent steroid use.   Past Medical History  Diagnosis Date  . Chronic obstructive asthma   . Hyperlipidemia   . Hypertension   . Uterine fibroid   . Periodic limb movement   . Osteoporosis   . Emphysema lung ([place])   . Sleep apnea     using CPAP   Past Surgical History  Procedure Laterality Date  . Total abdominal hysterectomy  1998  . Bilateral salpingoophorectomy  1998  . Knee arthroscopy      left  . Appendectomy     Family History  Problem Relation Age of Onset  . Lung cancer Father   . Bone cancer Father   . Hypertension Sister   . Hypothyroidism Sister    Social History  Substance Use Topics  . Smoking status: Former Smoker -- 1.00 packs/day for 20 years    Quit date: 04/27/1996  . Smokeless tobacco: None  . Alcohol Use: No   OB History    No data available     Review of Systems  Constitutional: Positive for fever.  Respiratory: Positive for cough and shortness of breath.   Cardiovascular: Negative for chest pain.  Gastrointestinal: Negative for nausea and vomiting.  All other systems reviewed and  are negative.  Allergies  Review of patient's allergies indicates no known allergies.  Home Medications   Prior to Admission medications   Medication Sig Start Date End Date Taking? Authorizing Provider  amLODipine (NORVASC) 5 MG tablet Take 5 mg by mouth daily.   Yes Historical Provider, MD  arformoterol (BROVANA) 15 MCG/2ML NEBU Take 15 mcg by nebulization 2 (two) times daily.   Yes Historical Provider, MD  aspirin 81 MG tablet Take 81 mg by mouth daily.     Yes Historical Provider, MD  budesonide (PULMICORT) 0.5 MG/2ML nebulizer solution Take 0.5 mg by nebulization 2 (two) times daily.   Yes Historical Provider, MD  cholecalciferol (VITAMIN D) 1000 UNITS tablet Take 2,000 Units by mouth 2 (two) times daily.    Yes Historical Provider, MD  cyanocobalamin 1000 MCG tablet Take 100 mcg by mouth daily.   Yes Historical Provider, MD  gabapentin (NEURONTIN) 100 MG capsule Take 200 mg by mouth 3 (three) times daily.   Yes Historical Provider, MD  ipratropium (ATROVENT) 0.02 % nebulizer solution Take 0.5 mg by nebulization 2 (two) times daily.   Yes Historical Provider, MD  rosuvastatin (CRESTOR) 10 MG tablet Take 20 mg by mouth daily.    Yes Historical Provider, MD  traMADol (ULTRAM) 50 MG tablet Take 1 tablet (50 mg total) by mouth Nightly. As needed 03/07/14  Yes Larey Seat, MD  valsartan-hydrochlorothiazide (DIOVAN-HCT) 320-25 MG per tablet Take 1 tablet by mouth daily.     Yes Historical Provider, MD   BP 168/94 mmHg  Temp(Src) 98.1 F (36.7 C) (Oral)  Resp 20  Ht 5\' 5"  (1.651 m)  Wt 291 lb (131.997 kg)  BMI 48.43 kg/m2  SpO2 86% Physical Exam  Constitutional: She is oriented to person, place, and time. She appears well-developed and well-nourished. No distress.  HENT:  Head: Normocephalic and atraumatic.  Eyes: EOM are normal.  Neck: Normal range of motion. No JVD present.  Cardiovascular: Regular rhythm and normal heart sounds.  Tachycardia present.   Pulmonary/Chest: She is  in respiratory distress (mild ). She has wheezes. She has no rales.  Diffuse wheezes No rales Prolonged expiratory phase Difficulty speaking in full sentences  Abdominal: Soft. She exhibits no distension. There is no tenderness.  Musculoskeletal: Normal range of motion. She exhibits edema.  BLE pitting edema   Neurological: She is alert and oriented to person, place, and time.  Skin: Skin is warm and dry.  Psychiatric: She has a normal mood and affect. Judgment normal.  Nursing note and vitals reviewed.   ED Course  Procedures (including critical care time) COORDINATION OF CARE: 4:31 PM Discussed treatment plan which includes lab work, CXR, EKG, and a breathing treatment with pt at bedside and pt agreed to plan.  6:17 PM I re-evaluated the patient and provided an update on the results of her CXR.   Labs Review Labs Reviewed  BASIC METABOLIC PANEL - Abnormal; Notable for the following:    Chloride 100 (*)    Glucose, Bld 115 (*)    All other components within normal limits  CBC WITH DIFFERENTIAL/PLATELET  BASIC METABOLIC PANEL  CBC WITH DIFFERENTIAL/PLATELET  BRAIN NATRIURETIC PEPTIDE    Imaging Review Dg Chest Port 1 View  06/02/2015  CLINICAL DATA:  70 year old female with shortness of breath for 4 days and productive cough. Former smoker. Initial encounter. EXAM: PORTABLE CHEST 1 VIEW COMPARISON:  03/13/2013 FINDINGS: Portable AP semi upright view at 1638 hours. A degree of chronic pulmonary hyperinflation is suspected. Stable cardiac size and mediastinal contours. Visualized tracheal air column is within normal limits. Allowing for portable technique, the lungs are clear. No pneumothorax identified. IMPRESSION: No acute cardiopulmonary abnormality. Electronically Signed   By: Genevie Ann M.D.   On: 06/02/2015 16:56   I have personally reviewed and evaluated these images and lab results as part of my medical decision-making.   EKG Interpretation   Date/Time:  Sunday June 02 2015 16:43:00 EST Ventricular Rate:  77 PR Interval:  162 QRS Duration: 82 QT Interval:  403 QTC Calculation: 456 R Axis:   80 Text Interpretation:  Sinus rhythm Consider left atrial enlargement Low  voltage, precordial leads Nonspecific T abnrm, anterolateral leads  Baseline wander in lead(s) V2 Confirmed by Alvino Chapel  MD, Ovid Curd [phone])  on 06/02/2015 5:41:11 PM      MDM   Final diagnoses:  Chronic obstructive pulmonary disease, unspecified COPD type ([place])  Hypoxemia    Patient with shortness of breath. Has been on antibiotics. Persistent dyspnea and hypoxia here while off oxygen. Will admit to internal medicine.    I personally performed the services described in this documentation, which was scribed in my presence. The recorded information has been reviewed and is accurate.      Davonna Belling, MD 06/02/15 [phone]

## 2015-06-02 NOTE — ED Notes (Signed)
Reviewed with pt using [place] to aid in [facility] tx therapy, pt states she is beginning to feel some better with breathing.

## 2015-06-02 NOTE — Progress Notes (Signed)
Accepted to [facility] tele bed in transfer from [facility] for COPD with acute exacerbation secondary to acute viral URI. No home O2 requirement, now dependent on 2L to maintain sat in low 90s.  CXR no infiltrate.  Hemodynamics stable. Received DuoNeb, Rocephin, and 125 mg IVP Solu-Medrol prior to transfer.

## 2015-06-02 NOTE — ED Notes (Signed)
Pt may be transported to [facility] by [facility] off telemetry and on pulse oximetry per Dr. Alvino Chapel (paper order on chart).

## 2015-06-03 ENCOUNTER — Inpatient Hospital Stay (HOSPITAL_COMMUNITY): Payer: Medicare HMO

## 2015-06-03 DIAGNOSIS — M79605 Pain in left leg: Secondary | ICD-10-CM

## 2015-06-03 DIAGNOSIS — M79606 Pain in leg, unspecified: Secondary | ICD-10-CM | POA: Insufficient documentation

## 2015-06-03 LAB — CBC WITH DIFFERENTIAL/PLATELET
BASOS ABS: 0 10*3/uL (ref 0.0–0.1)
BASOS PCT: 0 %
Eosinophils Absolute: 0 10*3/uL (ref 0.0–0.7)
Eosinophils Relative: 0 %
HEMATOCRIT: 43.2 % (ref 36.0–46.0)
HEMOGLOBIN: 14.7 g/dL (ref 12.0–15.0)
LYMPHS PCT: 5 %
Lymphs Abs: 0.6 10*3/uL — ABNORMAL LOW (ref 0.7–4.0)
MCH: 32.7 pg (ref 26.0–34.0)
MCHC: 34 g/dL (ref 30.0–36.0)
MCV: 96.2 fL (ref 78.0–100.0)
MONO ABS: 0.1 10*3/uL (ref 0.1–1.0)
Monocytes Relative: 1 %
NEUTROS ABS: 11.8 10*3/uL — AB (ref 1.7–7.7)
NEUTROS PCT: 94 %
Platelets: 300 10*3/uL (ref 150–400)
RBC: 4.49 MIL/uL (ref 3.87–5.11)
RDW: 13.5 % (ref 11.5–15.5)
WBC: 12.5 10*3/uL — ABNORMAL HIGH (ref 4.0–10.5)

## 2015-06-03 LAB — GLUCOSE, CAPILLARY
GLUCOSE-CAPILLARY: 147 mg/dL — AB (ref 65–99)
GLUCOSE-CAPILLARY: 175 mg/dL — AB (ref 65–99)
GLUCOSE-CAPILLARY: 168 mg/dL — AB (ref 65–99)

## 2015-06-03 LAB — BASIC METABOLIC PANEL
ANION GAP: 15 (ref 5–15)
BUN: 11 mg/dL (ref 6–20)
CO2: 28 mmol/L (ref 22–32)
Calcium: 9.2 mg/dL (ref 8.9–10.3)
Chloride: 96 mmol/L — ABNORMAL LOW (ref 101–111)
Creatinine, Ser: 0.88 mg/dL (ref 0.44–1.00)
GLUCOSE: 258 mg/dL — AB (ref 65–99)
POTASSIUM: 3.9 mmol/L (ref 3.5–5.1)
Sodium: 139 mmol/L (ref 135–145)

## 2015-06-03 LAB — BRAIN NATRIURETIC PEPTIDE: B Natriuretic Peptide: 51.8 pg/mL (ref 0.0–100.0)

## 2015-06-03 MED ORDER — PRO-STAT SUGAR FREE PO LIQD
30.0000 mL | Freq: Every day | ORAL | Status: DC
  Administered 2015-06-03: 30 mL via ORAL
  Filled 2015-06-03 (×2): qty 30

## 2015-06-03 MED ORDER — HYDROCHLOROTHIAZIDE 25 MG PO TABS
25.0000 mg | ORAL_TABLET | Freq: Every day | ORAL | Status: DC
  Administered 2015-06-03 – 2015-06-04 (×2): 25 mg via ORAL
  Filled 2015-06-03 (×2): qty 1

## 2015-06-03 MED ORDER — INSULIN ASPART 100 UNIT/ML ~~LOC~~ SOLN: 0.0000 [IU] | Freq: Every day | SUBCUTANEOUS | Status: DC

## 2015-06-03 MED ORDER — INSULIN ASPART 100 UNIT/ML ~~LOC~~ SOLN
0.0000 [IU] | Freq: Three times a day (TID) | SUBCUTANEOUS | Status: DC
  Administered 2015-06-03: 2 [IU] via SUBCUTANEOUS
  Administered 2015-06-04: 3 [IU] via SUBCUTANEOUS
  Administered 2015-06-04: 1 [IU] via SUBCUTANEOUS

## 2015-06-03 MED ORDER — IRBESARTAN 300 MG PO TABS
300.0000 mg | ORAL_TABLET | Freq: Every day | ORAL | Status: DC
  Administered 2015-06-03 – 2015-06-04 (×2): 300 mg via ORAL
  Filled 2015-06-03 (×2): qty 1

## 2015-06-03 NOTE — Evaluation (Addendum)
Occupational Therapy Evaluation Patient Details Name: Cassandra Hart MRN: [id number] DOB: 26-Oct-1945 Today's Date: 06/03/2015    History of Present Illness Cassandra Hart is a 70 y.o. female with PMH of hypertension, hyperlipidemia, COPD, and OSA/OHS on CPAP with 2 L/m supplemental oxygen at night who presents in transfer from [facility] where she presented with dyspnea on minimal exertion. She had been in her usual state of health until approximately 5 days ago when she developed a slight increase in her chronic dyspnea which was accompanied by subjective fevers. She saw her PCP for these complaints and was prescribed a Z-Pak. Despite this therapy, symptoms progressed over the ensuing days until she was dyspneic with minimal exertion, or even with speech, prompting her ED visit   Clinical Impression   Patient presenting with decreased ADL and functional mobility independence secondary to above. Patient overall independent PTA, requiring some assistance with LB ADLs from her daughter. Patient currently functioning at an overall min to mod assist level. Patient will benefit from acute OT to increase overall independence in the areas of ADLs, functional mobility, and overall safety in order to safely discharge home with family.    Upon entering room, pt on 4 L/min supplemental 02. Sats =93%. Dropped supplemental 02 to 3L/min. Pt ambulated to BR and sats dropped to 86%. With pursed lip breathing and rest break, patient's sats increased to 90% within about 1 minute. Pt stood from toilet, ambulated to sink for grooming task of washing hands, then ambulated back to recliner; sats =88%, increasing to 89-90% after about 1 minute. Increased to 4L/min to maintain a steady 90% oxygen rate upon leaving room.     Follow Up Recommendations  No OT follow up;Supervision - Intermittent    Equipment Recommendations   (TBD, possibly a 3-n-1)    Recommendations for Other Services  None at this time     Precautions / Restrictions Precautions Precautions: Fall Precaution Comments: monitor O2 during activtiy Restrictions Weight Bearing Restrictions: No    Mobility Bed Mobility Pt found seated in recliner upon OT entering/exiting room  Transfers Overall transfer level: Needs assistance Equipment used: 1 person hand held assist Transfers: Sit to/from Stand Sit to Stand: Min guard General transfer comment: Min guard for safety, pt takes increased time due to generalized weakness and body habitus    Balance Overall balance assessment: Needs assistance Sitting-balance support: No upper extremity supported;Feet supported Sitting balance-Leahy Scale: Good     Standing balance support: No upper extremity supported;During functional activity Standing balance-Leahy Scale: Fair      ADL Overall ADL's : Needs assistance/impaired Eating/Feeding: Set up;Sitting   Grooming: Min guard;Standing Grooming Details (indicate cue type and reason): at sink Upper Body Bathing: Set up;Sitting   Lower Body Bathing: Minimal assistance;Sit to/from stand Lower Body Bathing Details (indicate cue type and reason): recommending pt purchase a LH sponge Upper Body Dressing : Set up;Sitting   Lower Body Dressing: Moderate assistance;Sit to/from stand Lower Body Dressing Details (indicate cue type and reason): recommending sock aid, pt already has a Secondary school teacher - pt reports her daughter normally assists with LB ADLs Toilet Transfer: Min guard;Grab bars;Comfort height toilet           Functional mobility during ADLs: Min guard;Cueing for safety General ADL Comments: Pt functioning at an overall min guard for functional mobility and needs increased independence with LB ADLs, pt did report her daughter was assisting with this PTA.     Pertinent Vitals/Pain Pain Assessment: No/denies pain  Hand Dominance Right   Extremity/Trunk Assessment Upper Extremity Assessment Upper Extremity Assessment:  Overall WFL for tasks assessed   Lower Extremity Assessment Lower Extremity Assessment: Overall WFL for tasks assessed   Cervical / Trunk Assessment Cervical / Trunk Assessment: Normal   Communication Communication Communication: No difficulties   Cognition Arousal/Alertness: Awake/alert Behavior During Therapy: WFL for tasks assessed/performed Overall Cognitive Status: Within Functional Limits for tasks assessed                Home Living Family/patient expects to be discharged to:: Private residence Living Arrangements: Spouse/significant other Available Help at Discharge: Family;Friend(s);Available 24 hours/day Type of Home: House Home Access: Level entry     Home Layout: One level     Bathroom Shower/Tub: Occupational psychologist: Handicapped height Bathroom Accessibility: Yes   Home Equipment: Financial controller: Reacher Additional Comments: Pt reports she may have a tub transfer bench from her mother, but she will have to check      Prior Functioning/Environment Level of Independence: Independent  Comments: lives with husband who is in good health, both retired, drives regularly    OT Diagnosis: Generalized weakness   OT Problem List: Decreased strength;Decreased activity tolerance;Impaired balance (sitting and/or standing);Decreased safety awareness;Decreased knowledge of use of DME or AE;Obesity   OT Treatment/Interventions: Self-care/ADL training;Therapeutic exercise;Energy conservation;DME and/or AE instruction;Therapeutic activities;Patient/family education;Balance training    OT Goals(Current goals can be found in the care plan section) Acute Rehab OT Goals Patient Stated Goal: get better OT Goal Formulation: With patient Time For Goal Achievement: 06/17/15 Potential to Achieve Goals: Good ADL Goals Pt Will Perform Grooming: with modified independence;standing Pt Will Transfer to Toilet: with modified  independence;ambulating Pt Will Perform Tub/Shower Transfer: Shower transfer;ambulating;tub bench;with modified independence Additional ADL Goal #1: Pt will be educated on energy conservation strategies and be independent with verbalizing at least three techniques   OT Frequency: Min 2X/week   Barriers to D/C: None known at this time   End of Session Activity Tolerance: Patient tolerated treatment well Patient left: in chair;with call bell/phone within reach   Time: 1017-1030 OT Time Calculation (min): 13 min Charges:  OT General Charges $OT Visit: 1 Procedure OT Evaluation $OT Eval Moderate Complexity: 1 Procedure  Chrys Racer , MS, OTR/L, CLT Pager: [phone]  06/03/2015, 12:41 PM

## 2015-06-03 NOTE — Progress Notes (Signed)
*  Preliminary Results* Left lower extremity venous duplex completed. Study was technically limited due to patient body habitus. Visualized veins of the left lower extremity are negative for deep vein thrombosis. There is no evidence of left Baker's cyst.  06/03/2015 4:11 PM  Maudry Mayhew, RVT, RDCS, RDMS

## 2015-06-03 NOTE — Progress Notes (Signed)
PROGRESS NOTE  Cassandra Hart [id number] DOB: [birth year] DOA: 06/02/2015 PCP: Jerlyn Ly, MD Brief History 70 y.o. female with PMH of hypertension, hyperlipidemia, COPD, and OSA/OHS on CPAP with 2 L/m supplemental oxygen at night who presents in transfer from [facility] where she presented with dyspnea on minimal exertion. She had been in her usual state of health until approximately 5 days ago when she developed a slight increase in her chronic dyspnea which was accompanied by subjective fevers. The patient finished a Z-Pak on 05/30/2015 with no improvement of her symptoms. She states that she has had a worsening of her chronic cough which has been mostly nonproductive. She reports that her husband has been febrile with a URI type symptoms. She has been using her home nebulizer twice a day without improvement. Upon arrival, the patient was noted to be hypoxemic. She was treated with intravenous steroids, nebulized bronchodilators, and started on levofloxacin. Assessment/Plan: Acute respiratory failure with hypoxia -Secondary to COPD exacerbation -May have been precipitated by viral type infection -Presently stable on 4L [place] -Pulmonary hygiene COPD exacerbation -Continue intravenous steroids -Continue bronchodilators -Continue levofloxacin -add pulmicort neb Hypertension -Continue amlodipine, ARB, HCTZ Hyperglycemia -Exacerbated by steroids -Hemoglobin A1c -Start NovoLog sliding scale OSA/OHS - CPAP qHS with 2 Lpm supplemental O2 - Wt loss encouraged Peripheral neuropathy - Uncertain etiology given absence of DM or alcohol abuse  - Continue home-dose gabapentin  -Continue B12 supplementation Hyperlipidemia -Continue Crestor  Lower extremity edema/pain -duplex r/o DVT    Family Communication:   Daughter and husband at beside Disposition Plan:   Home 1-2 days        Procedures/Studies: Dg Chest Port 1 View  06/02/2015  CLINICAL DATA:   70 year old female with shortness of breath for 4 days and productive cough. Former smoker. Initial encounter. EXAM: PORTABLE CHEST 1 VIEW COMPARISON:  03/13/2013 FINDINGS: Portable AP semi upright view at 1638 hours. A degree of chronic pulmonary hyperinflation is suspected. Stable cardiac size and mediastinal contours. Visualized tracheal air column is within normal limits. Allowing for portable technique, the lungs are clear. No pneumothorax identified. IMPRESSION: No acute cardiopulmonary abnormality. Electronically Signed   By: Genevie Ann M.D.   On: 06/02/2015 16:56         Subjective: Patient says that she is breathing better but still has dyspnea with exertion. Denies any fevers, chills, chest pain, nausea, vomiting, diarrhea, vomiting. No dysuria or hematuria. No rashes. Denies headache or neck pain.  Objective: Filed Vitals:   06/02/15 2221 06/03/15 0030 06/03/15 0258 06/03/15 0930  BP: 152/70   119/69  Pulse:    84  Temp: 98.4 F (36.9 C)   98.3 F (36.8 C)  TempSrc: Oral   Oral  Resp: 24   18  Height: 5\' 5"  (1.651 m)     Weight: 132.1 kg (291 lb 3.6 oz)     SpO2: 95% 91% 92% 95%    Intake/Output Summary (Last 24 hours) at 06/03/15 1225 Last data filed at 06/03/15 0900  Gross per 24 hour  Intake    240 ml  Output      0 ml  Net    240 ml   Weight change:  Exam:   General:  Pt is alert, follows commands appropriately, not in acute distress  HEENT: No icterus, No thrush, No neck mass, [place]/AT  Cardiovascular: RRR, S1/S2, no rubs, no gallops  Respiratory: Bibasilar rales. Bilateral extremities.  Abdomen: Soft/+BS, non  tender, non distended, no guarding  Extremities: 1+LE edema, No lymphangitis, No petechiae, No rashes, no synovitis  Data Reviewed: Basic Metabolic Panel:  Recent Labs Lab 06/02/15 1655 06/03/15 0003  NA 140 139  K 3.8 3.9  CL 100* 96*  CO2 31 28  GLUCOSE 115* 258*  BUN 14 11  CREATININE 0.69 0.88  CALCIUM 9.1 9.2   Liver Function  Tests: No results for input(s): AST, ALT, ALKPHOS, BILITOT, PROT, ALBUMIN in the last 168 hours. No results for input(s): LIPASE, AMYLASE in the last 168 hours. No results for input(s): AMMONIA in the last 168 hours. CBC:  Recent Labs Lab 06/02/15 1655 06/03/15 0003  WBC 9.7 12.5*  NEUTROABS 7.0 11.8*  HGB 14.5 14.7  HCT 44.4 43.2  MCV 96.3 96.2  PLT 304 300   Cardiac Enzymes: No results for input(s): CKTOTAL, CKMB, CKMBINDEX, TROPONINI in the last 168 hours. BNP: Invalid input(s): POCBNP CBG:  Recent Labs Lab 06/03/15 0819  GLUCAP 147*    No results found for this or any previous visit (from the past 240 hour(s)).   Scheduled Meds: . amLODipine  5 mg Oral Daily  . aspirin EC  81 mg Oral Daily  . budesonide  0.5 mg Nebulization BID  . cholecalciferol  2,000 Units Oral BID  . enoxaparin (LOVENOX) injection  40 mg Subcutaneous Daily  . gabapentin  200 mg Oral TID  . irbesartan  300 mg Oral Daily   And  . hydrochlorothiazide  25 mg Oral Daily  . ipratropium-albuterol  3 mL Nebulization Q6H  . levofloxacin (LEVAQUIN) IV  750 mg Intravenous QHS  . methylPREDNISolone (SOLU-MEDROL) injection  60 mg Intravenous 4 times per day  . rosuvastatin  20 mg Oral Daily  . sodium chloride flush  3 mL Intravenous Q12H  . sodium chloride flush  3 mL Intravenous Q12H  . cyanocobalamin  1,000 mcg Oral Daily   Continuous Infusions:    Ginevra Tacker, DO  [facility] Pager [phone]  If 7PM-7AM, please contact night-coverage [email or web address] Password [email or web address] 06/03/2015, 12:25 PM   LOS: 1 day

## 2015-06-03 NOTE — Progress Notes (Signed)
SATURATION QUALIFICATIONS: (This note is used to comply with regulatory documentation for home oxygen)  Patient Saturations on Room Air at Rest = 94%  Patient Saturations on Room Air while Ambulating = 87%  Patient Saturations on 4 Liters of oxygen while Ambulating = 89%  Please briefly explain why patient needs home oxygen: Drop in O2 with increased mobility

## 2015-06-03 NOTE — Progress Notes (Signed)
Patient placed on HOme unit CPAP. Pressure of 12. 4L O2 bleed in. Patient tolerating well. RT will continue to monitor as needed.

## 2015-06-03 NOTE — Progress Notes (Signed)
CRITICAL VALUE ALERT  Critical value received:  Ca 6.4  Date of notification:  06/03/2015  Time of notification:  7:58 AM  Critical value read back:Yes.    Nurse who received alert:  Retta Mac   MD notified (1st page):  Dr. Carles Collet  Time of first page:  7:58 AM  MD notified (2nd page):  Time of second page:  Responding MD:  Dr Carles Collet  Time MD responded:  [phone]

## 2015-06-03 NOTE — Care Management Important Message (Signed)
Important Message  Patient Details  Name: INARA ADAIR MRN: [id number] Date of Birth: 1946/04/10   Medicare Important Message Given:  Yes    Louanne Belton 06/03/2015, 11:14 AMImportant Message  Patient Details  Name: MONTRELL TOOMES MRN: [id number] Date of Birth: 07/13/1945   Medicare Important Message Given:  Yes    Jaycen Vercher G 06/03/2015, 11:14 AM

## 2015-06-03 NOTE — Evaluation (Signed)
Physical Therapy Evaluation Patient Details Name: Cassandra Hart MRN: [id number] DOB: [birth year] Today's Date: 06/03/2015   History of Present Illness  Cassandra Hart is a 70 y.o. female with PMH of hypertension, hyperlipidemia, COPD, and OSA/OHS on CPAP with 2 L/m supplemental oxygen at night who presents in transfer from [facility] where she presented with dyspnea on minimal exertion. She had been in her usual state of health until approximately 5 days ago when she developed a slight increase in her chronic dyspnea which was accompanied by subjective fevers. She saw her PCP for these complaints and was prescribed a Z-Pak. Despite this therapy, symptoms progressed over the ensuing days until she was dyspneic with minimal exertion, or even with speech, prompting her ED visit  Clinical Impression  Pt presenting today with the above medical history, very pleasant and willing to work with PT. Pt needing supplemental O2 for all mobility today as saturation dropped to 88% with minimal activity. Pt tolerated ambulating 75 ft with one rest break for increased dyspnea. Requiring use of IV pole today for steadying. Pt may benefit from Rollator if her independence does not improve as she becomes more medically stable. Will continue to follow pt acutely before D/C home with family and spouse.     Follow Up Recommendations No PT follow up;Supervision for mobility/OOB    Equipment Recommendations  Other (comment);Rolling walker with 5" wheels (May need Rollator for energy conservation if using O2)    Recommendations for Other Services       Precautions / Restrictions Precautions Precautions: Fall Precaution Comments: monitor O2 during activtiy Restrictions Weight Bearing Restrictions: No      Mobility  Bed Mobility Overal bed mobility: Needs Assistance Bed Mobility: Supine to Sit     Supine to sit: HOB elevated;Min assist     General bed mobility comments: HOB elevated use of rails  to pivot to EOB, assisted with minimal HHA from daughter  Transfers Overall transfer level: Needs assistance Equipment used: 1 person hand held assist Transfers: Sit to/from Stand Sit to Stand: Min assist         General transfer comment: Min A for initial boost to stand at EOB bed, steady with standing  Ambulation/Gait Ambulation/Gait assistance: Min assist Ambulation Distance (Feet): 75 Feet Assistive device:  (IV pole) Gait Pattern/deviations: Decreased stride length;Step-through pattern;Wide base of support Gait velocity: slow Gait velocity interpretation: Below normal speed for age/gender General Gait Details: Relies on IV pole for gait, one standing rest break to catch breath, increased dyspnea  Stairs            Wheelchair Mobility    Modified Rankin (Stroke Patients Only)       Balance Overall balance assessment: Needs assistance Sitting-balance support: No upper extremity supported;Feet supported Sitting balance-Leahy Scale: Good     Standing balance support: Single extremity supported;During functional activity Standing balance-Leahy Scale: Fair Standing balance comment: reliance on RW with ambulation                             Pertinent Vitals/Pain Pain Assessment: No/denies pain    Home Living Family/patient expects to be discharged to:: Private residence Living Arrangements: Spouse/significant other Available Help at Discharge: Family;Friend(s);Available 24 hours/day Type of Home: House Home Access: Level entry     Home Layout: One level Home Equipment: None      Prior Function Level of Independence: Independent  Comments: lives with husband who is in good health, both retired, drives regularly     Journalist, newspaper   Dominant Hand: Right    Extremity/Trunk Assessment   Upper Extremity Assessment: Overall WFL for tasks assessed           Lower Extremity Assessment: Overall WFL for tasks assessed       Cervical / Trunk Assessment: Normal  Communication   Communication: No difficulties  Cognition Arousal/Alertness: Awake/alert Behavior During Therapy: WFL for tasks assessed/performed Overall Cognitive Status: Within Functional Limits for tasks assessed                      General Comments General comments (skin integrity, edema, etc.): Pt on 4L O2, transferred to EOB on RA, O2 dropped to 88%, ambulated on 4L O2 [place]-dropped to 87%, returned to 92% after seated break    Exercises        Assessment/Plan    PT Assessment Patient needs continued PT services  PT Diagnosis Generalized weakness;Difficulty walking   PT Problem List Decreased activity tolerance;Decreased balance;Decreased mobility  PT Treatment Interventions Gait training;Functional mobility training;Therapeutic activities;Therapeutic exercise;Balance training;DME instruction   PT Goals (Current goals can be found in the Care Plan section) Acute Rehab PT Goals Patient Stated Goal: get better PT Goal Formulation: With patient Time For Goal Achievement: 06/17/15 Potential to Achieve Goals: Good    Frequency Min 3X/week   Barriers to discharge        Co-evaluation               End of Session Equipment Utilized During Treatment: Gait belt;Oxygen Activity Tolerance: Patient tolerated treatment well;Patient limited by fatigue Patient left: in chair;with call bell/phone within reach;with family/visitor present Nurse Communication: Mobility status         Time: 0732-0752 PT Time Calculation (min) (ACUTE ONLY): 20 min   Charges:   PT Evaluation $PT Eval Moderate Complexity: 1 Procedure     PT G Codes:        Ara Kussmaul 2015-07-01, 9:51 AM Ara Kussmaul, Student Physical Therapist Acute Rehab [phone]

## 2015-06-03 NOTE — Progress Notes (Signed)
Initial Nutrition Assessment  DOCUMENTATION CODES:   Morbid obesity  INTERVENTION:  Provide 30 ml Prostat po once daily, each supplement provides 100 kcal and 15 grams of protein.   Encourage adequate PO intake.   NUTRITION DIAGNOSIS:   Increased nutrient needs related to chronic illness as evidenced by estimated needs.  GOAL:   Patient will meet greater than or equal to 90% of their needs  MONITOR:   PO intake, Labs, Supplement acceptance, Weight trends, I & O's  REASON FOR ASSESSMENT:   Consult Assessment of nutrition requirement/status  ASSESSMENT:   70 y.o. female with PMH of hypertension, hyperlipidemia, COPD, and OSA/OHS on CPAP with 2 L/m supplemental oxygen at night who presents in transfer from [facility] where she presented with dyspnea on minimal exertion. She had been in her usual state of health until approximately 5 days ago when she developed a slight increase in her chronic dyspnea which was accompanied by subjective fevers.  Pt reports her appetite is improving. Meal completion has been 75%. Pt does however report having poor po intake and a lack of appetite 4-5 days prior to admission. She reports only able to consume bites of food at meals. Weight has been stable with usual body weight of 291 lbs. Pt with no observed significant fat or muscle mass loss. RD to order Prostat to aid in protein needs. Pt encouraged to eat her food at meals.   Labs and medications reviewed.   Diet Order:  Diet Carb Modified Fluid consistency:: Thin; Room service appropriate?: Yes  Skin:  Reviewed, no issues  Last BM:  2/5  Height:   Ht Readings from Last 1 Encounters:  06/02/15 5\' 5"  (1.651 m)    Weight:   Wt Readings from Last 1 Encounters:  06/02/15 291 lb 3.6 oz (132.1 kg)    Ideal Body Weight:  56.8 kg  BMI:  Body mass index is 48.46 kg/(m^2).  Estimated Nutritional Needs:   Kcal:  1900-2100  Protein:  110-130 grams  Fluid:  1.9 - 2.1  L/day  EDUCATION NEEDS:   No education needs identified at this time  Corrin Parker, MS, RD, LDN Pager # [phone] After hours/ weekend pager # [phone]

## 2015-06-03 NOTE — Progress Notes (Signed)
RT note:  Pt requested cpap from [facility] and was told we had nasal pillows at [facility], which are unavailable.  Pt's husband left to get pt's nasal pillows from home and the fittings were incompatible with our machine.  Pt wanted to try a nasal mask but decided against it.  Machine is in pt's room at this time because everything was opened and prepared for use when pt decided against wearing it.  RT explained to pt to bring cpap from home on future visits and to contact husband to bring it tomorrow.  Pt has agreed to wear our nasal mask tomorrow if he does not bring her machine.  Rt will continue to monitor.

## 2015-06-04 LAB — BASIC METABOLIC PANEL
ANION GAP: 13 (ref 5–15)
BUN: 20 mg/dL (ref 6–20)
CALCIUM: 9.4 mg/dL (ref 8.9–10.3)
CO2: 26 mmol/L (ref 22–32)
CREATININE: 0.75 mg/dL (ref 0.44–1.00)
Chloride: 100 mmol/L — ABNORMAL LOW (ref 101–111)
Glucose, Bld: 158 mg/dL — ABNORMAL HIGH (ref 65–99)
Potassium: 4.2 mmol/L (ref 3.5–5.1)
SODIUM: 139 mmol/L (ref 135–145)

## 2015-06-04 LAB — GLUCOSE, CAPILLARY
GLUCOSE-CAPILLARY: 147 mg/dL — AB (ref 65–99)
GLUCOSE-CAPILLARY: 208 mg/dL — AB (ref 65–99)

## 2015-06-04 MED ORDER — LEVOFLOXACIN 750 MG PO TABS: 750.0000 mg | ORAL_TABLET | Freq: Every day | ORAL | Status: DC

## 2015-06-04 MED ORDER — PREDNISONE 10 MG PO TABS: 60.0000 mg | ORAL_TABLET | Freq: Every day | ORAL | Status: DC

## 2015-06-04 NOTE — Care Management Note (Signed)
Case Management Note  Patient Details  Name: Cassandra Hart MRN: [id number] Date of Birth: 01-05-1946  Subjective/Objective:       CM following for progression and d/c planning.             Action/Plan: 06/04/2015 Notified by Dr Tat that pt will need continuous oxygen , currently on oxygen at night only for CPAP bleed thru. The pt uses [facility] for oxygen supply . This CM notified [facility] of change in pt oxygen need and orders faxed, with request for delivery of tank to pt hospital room.   Expected Discharge Date:     06/04/2015             Expected Discharge Plan:  Home/Self Care  In-House Referral:  NA  Discharge planning Services  CM Consult  Post Acute Care Choice:  Durable Medical Equipment Choice offered to:  Patient  DME Arranged:  Oxygen DME Agency:  [facility]  HH Arranged:  NA HH Agency:  NA  Status of Service:  Completed, signed off  Medicare Important Message Given:  Yes Date Medicare IM Given:    Medicare IM give by:    Date Additional Medicare IM Given:    Additional Medicare Important Message give by:     If discussed at [place] of Stay Meetings, dates discussed:    Additional Comments:  Adron Bene, RN 06/04/2015, 3:10 PM

## 2015-06-04 NOTE — Progress Notes (Signed)
Discharge papers printed,explained and given to the patient and her daughter.Questions were answered satisfactorily at the time of discharge.Asked by the nurse ,if they know how to operate oxygen and its precautions,mother and daughter said they have been taught by the man who delivered oxygen from the agency.Disharged on stable condition.No skin issues at the time of discharged.

## 2015-06-04 NOTE — Progress Notes (Signed)
Physical Therapy Treatment Patient Details Name: Cassandra Hart MRN: [id number] DOB: [birth year] Today's Date: 06/04/2015    History of Present Illness Cassandra Hart is a 70 y.o. female with PMH of hypertension, hyperlipidemia, COPD, and OSA/OHS on CPAP with 2 L/m supplemental oxygen at night who presents in transfer from [facility] where she presented with dyspnea on minimal exertion. She had been in her usual state of health until approximately 5 days ago when she developed a slight increase in her chronic dyspnea which was accompanied by subjective fevers. She saw her PCP for these complaints and was prescribed a Z-Pak. Despite this therapy, symptoms progressed over the ensuing days until she was dyspneic with minimal exertion, or even with speech, prompting her ED visit    PT Comments    Pt performed increased gait distance to 120 ft with no AD.  Pt O2 sats dropped post tx ( see vitals).  Pt educated on continued mobility at home on d/c.    Follow Up Recommendations  No PT follow up;Supervision for mobility/OOB     Equipment Recommendations  Other (comment);Rolling walker with 5" wheels    Recommendations for Other Services       Precautions / Restrictions Precautions Precautions: Fall Precaution Comments: monitor O2 during activtiy Restrictions Weight Bearing Restrictions: No    Mobility  Bed Mobility Overal bed mobility:  (Pt recieved in recliner chair.  )                Transfers Overall transfer level: Needs assistance Equipment used: None Transfers: Sit to/from Stand Sit to Stand: Supervision         General transfer comment: Demo's good technique and hand placement.  Required cues for management of O2 line as pt will recieve O2 at home now.    Ambulation/Gait Ambulation/Gait assistance: Supervision Ambulation Distance (Feet): 120 Feet Assistive device: None Gait Pattern/deviations: Decreased step length - right;Decreased step length -  left;Wide base of support;Shuffle Gait velocity: slow Gait velocity interpretation: Below normal speed for age/gender General Gait Details: Pt performed wide based waddling gt with fatigue noted and DOE.  O2 sats WNL 88%-93% on 3L during activity and decreased to 84 % post gait training.  Pt educated on importance of pursed lip breathing and continued mobility at d/c.     Stairs            Wheelchair Mobility    Modified Rankin (Stroke Patients Only)       Balance     Sitting balance-Leahy Scale: Good       Standing balance-Leahy Scale: Good                      Cognition Arousal/Alertness: Awake/alert Behavior During Therapy: WFL for tasks assessed/performed Overall Cognitive Status: Within Functional Limits for tasks assessed                      Exercises      General Comments        Pertinent Vitals/Pain Pain Assessment: No/denies pain    Home Living                      Prior Function            PT Goals (current goals can now be found in the care plan section) Acute Rehab PT Goals Patient Stated Goal: go home soon Potential to Achieve Goals: Good Progress towards PT goals: Progressing toward  goals    Frequency  Min 3X/week    PT Plan      Co-evaluation             End of Session Equipment Utilized During Treatment: Gait belt;Oxygen (pulse ox, telemetry box.) Activity Tolerance: Patient tolerated treatment well;Patient limited by fatigue Patient left: in chair;with call bell/phone within reach;with family/visitor present     Time: [id number] PT Time Calculation (min) (ACUTE ONLY): 10 min  Charges:  $Gait Training: 8-22 mins                    G Codes:      Cristela Blue 06/29/15, 2:41 PM  Governor Rooks, PTA pager [phone]

## 2015-06-04 NOTE — Progress Notes (Signed)
Attempted service recovery; will inform manager for further follow up.

## 2015-06-04 NOTE — Discharge Summary (Signed)
Physician Discharge Summary  Cassandra Hart [id number] DOB: 07-28-1945 DOA: 06/02/2015  PCP: Jerlyn Ly, MD  Admit date: 06/02/2015 Discharge date: 06/04/2015  Recommendations for Outpatient Follow-up:  1. Pt will need to follow up with PCP in 2 weeks post discharge   Discharge Diagnoses:  Acute respiratory failure with hypoxia -Secondary to COPD exacerbation -May have been precipitated by viral type infection -Presently stable on 3L [place] -Pulmonary hygiene -ambulatory pulseox --desaturated to 87% on RA -home with supplemental oxygen @3L  COPD exacerbation -Continue intravenous steroids-->home with prednisone taper -Continue bronchodilators -Continue levofloxacin 3 more days to complete 5 days of tx -add pulmicort neb-->continue home dose after d/c -restart brovana after discharge Hypertension -Continue amlodipine, ARB, HCTZ Hyperglycemia -Exacerbated by steroids -Hemoglobin A1c--pending at time of d/c -Start NovoLog sliding scale OSA/OHS - CPAP qHS with 3 Lpm supplemental O2 - Wt loss encouraged Peripheral neuropathy - Uncertain etiology given absence of DM or alcohol abuse  - Continue home-dose gabapentin  -Continue B12 supplementation Hyperlipidemia -Continue Crestor Lower extremity edema/pain -duplex r/o DVT--negative  Discharge Condition: stable  Disposition: home  Diet:carb modified Wt Readings from Last 3 Encounters:  06/02/15 132.1 kg (291 lb 3.6 oz)  03/07/14 139.027 kg (306 lb 8 oz)  05/09/13 139.708 kg (308 lb)    History of present illness:  70 y.o. female with PMH of hypertension, hyperlipidemia, COPD, and OSA/OHS on CPAP with 2 L/m supplemental oxygen at night who presents in transfer from [facility] where she presented with dyspnea on minimal exertion. She had been in her usual state of health until approximately 5 days ago when she developed a slight increase in her chronic dyspnea which was accompanied by subjective fevers.  The patient finished a Z-Pak on 05/30/2015 with no improvement of her symptoms. She states that she has had a worsening of her chronic cough which has been mostly nonproductive. She reports that her husband has been febrile with a URI type symptoms. She has been using her home nebulizer twice a day without improvement. Upon arrival, the patient was noted to be hypoxemic. She was treated with intravenous steroids, nebulized bronchodilators, and started on levofloxacin.  She improved clinically and will be d/c home with levofloxacin and prednisone taper.  Ambulatory pulse ox showed desaturation with ambulation.  She was set up with home oxygen at 3L  Discharge Exam: Filed Vitals:   06/04/15 0512 06/04/15 0824  BP: 149/77 136/65  Pulse: 77 72  Temp: 97.8 F (36.6 C) 97.7 F (36.5 C)  Resp: 20 19   Filed Vitals:   06/03/15 2012 06/03/15 2142 06/04/15 0512 06/04/15 0824  BP:  122/57 149/77 136/65  Pulse: 88 83 77 72  Temp:  98.5 F (36.9 C) 97.8 F (36.6 C) 97.7 F (36.5 C)  TempSrc:  Oral Oral Oral  Resp: 18 19 20 19   Height:      Weight:      SpO2: 96% 91% 94% 94%   General: A&O x 3, NAD, pleasant, cooperative Cardiovascular: RRR, no rub, no gallop, no S3 Respiratory: bibasilar rales, no wheeze Abdomen:soft, nontender, nondistended, positive bowel sounds Extremities: trace LE L>R edema, No lymphangitis, no petechiae  Discharge Instructions      Discharge Instructions    Diet - low sodium heart healthy    Complete by:  As directed      Increase activity slowly    Complete by:  As directed             Medication List  STOP taking these medications        traMADol 50 MG tablet  Commonly known as:  ULTRAM      TAKE these medications        albuterol 108 (90 Base) MCG/ACT inhaler  Commonly known as:  PROVENTIL HFA;VENTOLIN HFA  Inhale 1 puff into the lungs every 6 (six) hours as needed for wheezing or shortness of breath.     amLODipine 5 MG tablet  Commonly  known as:  NORVASC  Take 5 mg by mouth daily.     arformoterol 15 MCG/2ML Nebu  Commonly known as:  BROVANA  Take 15 mcg by nebulization 2 (two) times daily.     aspirin 81 MG tablet  Take 81 mg by mouth daily.     budesonide 0.5 MG/2ML nebulizer solution  Commonly known as:  PULMICORT  Take 0.5 mg by nebulization 2 (two) times daily.     cholecalciferol 1000 units tablet  Commonly known as:  VITAMIN D  Take 4,000 Units by mouth daily.     cyanocobalamin 1000 MCG tablet  Take 1,000 mcg by mouth daily.     gabapentin 100 MG capsule  Commonly known as:  NEURONTIN  Take 200 mg by mouth 3 (three) times daily.     ipratropium 0.02 % nebulizer solution  Commonly known as:  ATROVENT  Take 0.5 mg by nebulization 2 (two) times daily.     levofloxacin 750 MG tablet  Commonly known as:  LEVAQUIN  Take 1 tablet (750 mg total) by mouth at bedtime.     predniSONE 10 MG tablet  Commonly known as:  DELTASONE  Take 6 tablets (60 mg total) by mouth daily with breakfast. And decrease by one tablet daily  Start taking on:  06/05/2015     rosuvastatin 20 MG tablet  Commonly known as:  CRESTOR  Take 20 mg by mouth daily.     valsartan-hydrochlorothiazide 320-25 MG tablet  Commonly known as:  DIOVAN-HCT  Take 1 tablet by mouth daily.         The results of significant diagnostics from this hospitalization (including imaging, microbiology, ancillary and laboratory) are listed below for reference.    Significant Diagnostic Studies: Dg Chest Port 1 View  06/02/2015  CLINICAL DATA:  70 year old female with shortness of breath for 4 days and productive cough. Former smoker. Initial encounter. EXAM: PORTABLE CHEST 1 VIEW COMPARISON:  03/13/2013 FINDINGS: Portable AP semi upright view at 1638 hours. A degree of chronic pulmonary hyperinflation is suspected. Stable cardiac size and mediastinal contours. Visualized tracheal air column is within normal limits. Allowing for portable technique, the  lungs are clear. No pneumothorax identified. IMPRESSION: No acute cardiopulmonary abnormality. Electronically Signed   By: Genevie Ann M.D.   On: 06/02/2015 16:56     Microbiology: No results found for this or any previous visit (from the past 240 hour(s)).   Labs: Basic Metabolic Panel:  Recent Labs Lab 06/02/15 1655 06/03/15 0003 06/04/15 0617  NA 140 139 139  K 3.8 3.9 4.2  CL 100* 96* 100*  CO2 31 28 26   GLUCOSE 115* 258* 158*  BUN 14 11 20   CREATININE 0.69 0.88 0.75  CALCIUM 9.1 9.2 9.4   Liver Function Tests: No results for input(s): AST, ALT, ALKPHOS, BILITOT, PROT, ALBUMIN in the last 168 hours. No results for input(s): LIPASE, AMYLASE in the last 168 hours. No results for input(s): AMMONIA in the last 168 hours. CBC:  Recent Labs Lab 06/02/15 1655 06/03/15  0003  WBC 9.7 12.5*  NEUTROABS 7.0 11.8*  HGB 14.5 14.7  HCT 44.4 43.2  MCV 96.3 96.2  PLT 304 300   Cardiac Enzymes: No results for input(s): CKTOTAL, CKMB, CKMBINDEX, TROPONINI in the last 168 hours. BNP: Invalid input(s): POCBNP CBG:  Recent Labs Lab 06/03/15 0819 06/03/15 1740 06/03/15 2142 06/04/15 0826 06/04/15 1150  GLUCAP 147* 175* 168* 147* 208*    Time coordinating discharge:  Greater than 30 minutes  Signed:  Tinsley Lomas, DO [facility] Pager: [phone] 06/04/2015, 2:02 PM

## 2015-06-04 NOTE — Progress Notes (Signed)
Occupational Therapy Treatment Patient Details Name: Cassandra Hart MRN: [id number] DOB: 11-04-1945 Today's Date: 06/04/2015    History of present illness Cassandra Hart is a 70 y.o. female with PMH of hypertension, hyperlipidemia, COPD, and OSA/OHS on CPAP with 2 L/m supplemental oxygen at night who presents in transfer from [facility] where she presented with dyspnea on minimal exertion. She had been in her usual state of health until approximately 5 days ago when she developed a slight increase in her chronic dyspnea which was accompanied by subjective fevers. She saw her PCP for these complaints and was prescribed a Z-Pak. Despite this therapy, symptoms progressed over the ensuing days until she was dyspneic with minimal exertion, or even with speech, prompting her ED visit   OT comments  Patient progressing towards OT goals, continue plan of care for now. Pt states she is feeling better. Pt on 4L/min supplemental 02 upon OT entering room. Decreased supplemental 02 to 3L/min. After simple activity, patient's sats decreased to 84%. With seated rest break and pursed lip breathing, patient's sats increased to a steady 94%. Administered 6 page handout on energy conservation techniques and went over this with patient and patient's daughter.    Follow Up Recommendations  No OT follow up;Supervision - Intermittent    Equipment Recommendations  Other (comment) (LH sponge and sock aid)    Recommendations for Other Services  None at this time   Precautions / Restrictions Precautions Precautions: Fall Precaution Comments: monitor O2 during activtiy Restrictions Weight Bearing Restrictions: No    Mobility Bed Mobility General bed mobility comments: Pt found seated in recliner upon OT entering/exiting room  Transfers Overall transfer level: Needs assistance Equipment used: None Transfers: Sit to/from Stand Sit to Stand: Min guard General transfer comment: Min guard for safety, pt  takes increased time due to generalized weakness and body habitus    Balance Overall balance assessment: Needs assistance Sitting-balance support: No upper extremity supported;Feet supported Sitting balance-Leahy Scale: Good     Standing balance support: No upper extremity supported;During functional activity Standing balance-Leahy Scale: Fair Standing balance comment: Pt with minor LOB during functional turn in room, therapist there to assist.    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 Went over energy conservation techniques with handout given. Encourage pt purchase sock aid and LH sponge to increase independence and incorporate energy conservation into ADL. Pt states she has a tub transfer bench that she can place in her walk-in shower. Pt ambulated to/from door on 3L/min supplemental 02 and sats decreased to 84%, with seated rest break and pursed lip breathing, patient's sats increased to a steady 94%.            Cognition   Behavior During Therapy: WFL for tasks assessed/performed Overall Cognitive Status:  Within Functional Limits for tasks assessed                  Pertinent Vitals/ Pain       Pain Assessment: No/denies pain   Frequency Min 2X/week     Progress Toward Goals  OT Goals(current goals can now befound in the care plan section)  Progress towards OT goals: Progressing toward goals  Acute Rehab OT Goals Patient Stated Goal: go home soon OT Goal Formulation: With patient/family Time For Goal Achievement: 06/17/15 Potential to Achieve Goals: Good  Plan Discharge plan remains appropriate    End of Session Equipment Utilized During Treatment: Oxygen   Activity Tolerance Patient tolerated treatment well   Patient Left in chair;with call bell/phone within reach;with family/visitor present  Nurse Communication Other (comment) (decreased patient's supplemental 02 to 3L/min)     [id number] OT Time Calculation (min): 19 min  Charges: OT General Charges $OT Visit: 1 Procedure OT Treatments $Self Care/Home Management : 8-22 mins  Chrys Racer , MS, OTR/L, CLT Pager: [phone]  06/04/2015, 9:31 AM

## 2015-06-05 LAB — HEMOGLOBIN A1C
HEMOGLOBIN A1C: 6.5 % — AB (ref 4.8–5.6)
MEAN PLASMA GLUCOSE: 140 mg/dL

## 2015-06-10 DIAGNOSIS — G4733 Obstructive sleep apnea (adult) (pediatric): Secondary | ICD-10-CM | POA: Diagnosis not present

## 2015-06-10 DIAGNOSIS — J449 Chronic obstructive pulmonary disease, unspecified: Secondary | ICD-10-CM | POA: Diagnosis not present

## 2015-06-10 DIAGNOSIS — I10 Essential (primary) hypertension: Secondary | ICD-10-CM | POA: Diagnosis not present

## 2015-06-14 DIAGNOSIS — E119 Type 2 diabetes mellitus without complications: Secondary | ICD-10-CM | POA: Diagnosis not present

## 2015-06-14 DIAGNOSIS — E559 Vitamin D deficiency, unspecified: Secondary | ICD-10-CM | POA: Diagnosis not present

## 2015-06-14 DIAGNOSIS — M810 Age-related osteoporosis without current pathological fracture: Secondary | ICD-10-CM | POA: Diagnosis not present

## 2015-06-24 DIAGNOSIS — G4733 Obstructive sleep apnea (adult) (pediatric): Secondary | ICD-10-CM | POA: Diagnosis not present

## 2015-06-24 DIAGNOSIS — I10 Essential (primary) hypertension: Secondary | ICD-10-CM | POA: Diagnosis not present

## 2015-06-24 DIAGNOSIS — J449 Chronic obstructive pulmonary disease, unspecified: Secondary | ICD-10-CM | POA: Diagnosis not present

## 2015-06-24 DIAGNOSIS — J45998 Other asthma: Secondary | ICD-10-CM | POA: Diagnosis not present

## 2015-06-27 DIAGNOSIS — G4733 Obstructive sleep apnea (adult) (pediatric): Secondary | ICD-10-CM | POA: Diagnosis not present

## 2015-06-30 DIAGNOSIS — J449 Chronic obstructive pulmonary disease, unspecified: Secondary | ICD-10-CM | POA: Diagnosis not present

## 2015-06-30 DIAGNOSIS — I10 Essential (primary) hypertension: Secondary | ICD-10-CM | POA: Diagnosis not present

## 2015-06-30 DIAGNOSIS — G4733 Obstructive sleep apnea (adult) (pediatric): Secondary | ICD-10-CM | POA: Diagnosis not present

## 2015-07-04 DIAGNOSIS — Z6841 Body Mass Index (BMI) 40.0 and over, adult: Secondary | ICD-10-CM | POA: Diagnosis not present

## 2015-07-04 DIAGNOSIS — M810 Age-related osteoporosis without current pathological fracture: Secondary | ICD-10-CM | POA: Diagnosis not present

## 2015-07-04 DIAGNOSIS — J441 Chronic obstructive pulmonary disease with (acute) exacerbation: Secondary | ICD-10-CM | POA: Diagnosis not present

## 2015-07-04 DIAGNOSIS — E119 Type 2 diabetes mellitus without complications: Secondary | ICD-10-CM | POA: Diagnosis not present

## 2015-07-04 DIAGNOSIS — I10 Essential (primary) hypertension: Secondary | ICD-10-CM | POA: Diagnosis not present

## 2015-07-15 DIAGNOSIS — Z1231 Encounter for screening mammogram for malignant neoplasm of breast: Secondary | ICD-10-CM | POA: Diagnosis not present

## 2015-07-26 DIAGNOSIS — J45998 Other asthma: Secondary | ICD-10-CM | POA: Diagnosis not present

## 2015-07-28 DIAGNOSIS — G4733 Obstructive sleep apnea (adult) (pediatric): Secondary | ICD-10-CM | POA: Diagnosis not present

## 2015-07-31 DIAGNOSIS — G4733 Obstructive sleep apnea (adult) (pediatric): Secondary | ICD-10-CM | POA: Diagnosis not present

## 2015-07-31 DIAGNOSIS — I10 Essential (primary) hypertension: Secondary | ICD-10-CM | POA: Diagnosis not present

## 2015-07-31 DIAGNOSIS — J449 Chronic obstructive pulmonary disease, unspecified: Secondary | ICD-10-CM | POA: Diagnosis not present

## 2015-08-15 DIAGNOSIS — B351 Tinea unguium: Secondary | ICD-10-CM | POA: Diagnosis not present

## 2015-08-15 DIAGNOSIS — E1149 Type 2 diabetes mellitus with other diabetic neurological complication: Secondary | ICD-10-CM | POA: Diagnosis not present

## 2015-08-27 DIAGNOSIS — G4733 Obstructive sleep apnea (adult) (pediatric): Secondary | ICD-10-CM | POA: Diagnosis not present

## 2015-08-30 DIAGNOSIS — G4733 Obstructive sleep apnea (adult) (pediatric): Secondary | ICD-10-CM | POA: Diagnosis not present

## 2015-08-30 DIAGNOSIS — I10 Essential (primary) hypertension: Secondary | ICD-10-CM | POA: Diagnosis not present

## 2015-08-30 DIAGNOSIS — J45998 Other asthma: Secondary | ICD-10-CM | POA: Diagnosis not present

## 2015-08-30 DIAGNOSIS — J449 Chronic obstructive pulmonary disease, unspecified: Secondary | ICD-10-CM | POA: Diagnosis not present

## 2015-09-16 DIAGNOSIS — H2513 Age-related nuclear cataract, bilateral: Secondary | ICD-10-CM | POA: Diagnosis not present

## 2015-09-24 DIAGNOSIS — J45998 Other asthma: Secondary | ICD-10-CM | POA: Diagnosis not present

## 2015-09-27 DIAGNOSIS — G4733 Obstructive sleep apnea (adult) (pediatric): Secondary | ICD-10-CM | POA: Diagnosis not present

## 2015-09-30 DIAGNOSIS — G4733 Obstructive sleep apnea (adult) (pediatric): Secondary | ICD-10-CM | POA: Diagnosis not present

## 2015-09-30 DIAGNOSIS — I10 Essential (primary) hypertension: Secondary | ICD-10-CM | POA: Diagnosis not present

## 2015-09-30 DIAGNOSIS — J449 Chronic obstructive pulmonary disease, unspecified: Secondary | ICD-10-CM | POA: Diagnosis not present

## 2015-10-25 DIAGNOSIS — J45998 Other asthma: Secondary | ICD-10-CM | POA: Diagnosis not present

## 2015-10-27 DIAGNOSIS — G4733 Obstructive sleep apnea (adult) (pediatric): Secondary | ICD-10-CM | POA: Diagnosis not present

## 2015-11-13 DIAGNOSIS — I10 Essential (primary) hypertension: Secondary | ICD-10-CM | POA: Diagnosis not present

## 2015-11-13 DIAGNOSIS — J449 Chronic obstructive pulmonary disease, unspecified: Secondary | ICD-10-CM | POA: Diagnosis not present

## 2015-11-13 DIAGNOSIS — G4733 Obstructive sleep apnea (adult) (pediatric): Secondary | ICD-10-CM | POA: Diagnosis not present

## 2015-11-14 DIAGNOSIS — B351 Tinea unguium: Secondary | ICD-10-CM | POA: Diagnosis not present

## 2015-11-14 DIAGNOSIS — E1142 Type 2 diabetes mellitus with diabetic polyneuropathy: Secondary | ICD-10-CM | POA: Diagnosis not present

## 2015-11-14 DIAGNOSIS — L602 Onychogryphosis: Secondary | ICD-10-CM | POA: Diagnosis not present

## 2015-11-25 DIAGNOSIS — J45998 Other asthma: Secondary | ICD-10-CM | POA: Diagnosis not present

## 2015-11-27 DIAGNOSIS — G4733 Obstructive sleep apnea (adult) (pediatric): Secondary | ICD-10-CM | POA: Diagnosis not present

## 2015-12-25 DIAGNOSIS — J449 Chronic obstructive pulmonary disease, unspecified: Secondary | ICD-10-CM | POA: Diagnosis not present

## 2015-12-25 DIAGNOSIS — J45998 Other asthma: Secondary | ICD-10-CM | POA: Diagnosis not present

## 2015-12-28 DIAGNOSIS — G4733 Obstructive sleep apnea (adult) (pediatric): Secondary | ICD-10-CM | POA: Diagnosis not present

## 2016-01-20 DIAGNOSIS — J449 Chronic obstructive pulmonary disease, unspecified: Secondary | ICD-10-CM | POA: Diagnosis not present

## 2016-01-27 DIAGNOSIS — G4733 Obstructive sleep apnea (adult) (pediatric): Secondary | ICD-10-CM | POA: Diagnosis not present

## 2016-01-29 DIAGNOSIS — J449 Chronic obstructive pulmonary disease, unspecified: Secondary | ICD-10-CM | POA: Diagnosis not present

## 2016-01-29 DIAGNOSIS — G4733 Obstructive sleep apnea (adult) (pediatric): Secondary | ICD-10-CM | POA: Diagnosis not present

## 2016-01-29 DIAGNOSIS — I10 Essential (primary) hypertension: Secondary | ICD-10-CM | POA: Diagnosis not present

## 2016-02-13 DIAGNOSIS — E559 Vitamin D deficiency, unspecified: Secondary | ICD-10-CM | POA: Diagnosis not present

## 2016-02-13 DIAGNOSIS — E784 Other hyperlipidemia: Secondary | ICD-10-CM | POA: Diagnosis not present

## 2016-02-13 DIAGNOSIS — D519 Vitamin B12 deficiency anemia, unspecified: Secondary | ICD-10-CM | POA: Diagnosis not present

## 2016-02-13 DIAGNOSIS — I10 Essential (primary) hypertension: Secondary | ICD-10-CM | POA: Diagnosis not present

## 2016-02-13 DIAGNOSIS — N390 Urinary tract infection, site not specified: Secondary | ICD-10-CM | POA: Diagnosis not present

## 2016-02-13 DIAGNOSIS — E119 Type 2 diabetes mellitus without complications: Secondary | ICD-10-CM | POA: Diagnosis not present

## 2016-02-13 DIAGNOSIS — R8299 Other abnormal findings in urine: Secondary | ICD-10-CM | POA: Diagnosis not present

## 2016-02-13 DIAGNOSIS — D539 Nutritional anemia, unspecified: Secondary | ICD-10-CM | POA: Diagnosis not present

## 2016-02-20 DIAGNOSIS — Z23 Encounter for immunization: Secondary | ICD-10-CM | POA: Diagnosis not present

## 2016-02-20 DIAGNOSIS — G609 Hereditary and idiopathic neuropathy, unspecified: Secondary | ICD-10-CM | POA: Diagnosis not present

## 2016-02-20 DIAGNOSIS — J45998 Other asthma: Secondary | ICD-10-CM | POA: Diagnosis not present

## 2016-02-20 DIAGNOSIS — G608 Other hereditary and idiopathic neuropathies: Secondary | ICD-10-CM | POA: Diagnosis not present

## 2016-02-20 DIAGNOSIS — Z1389 Encounter for screening for other disorder: Secondary | ICD-10-CM | POA: Diagnosis not present

## 2016-02-20 DIAGNOSIS — I10 Essential (primary) hypertension: Secondary | ICD-10-CM | POA: Diagnosis not present

## 2016-02-20 DIAGNOSIS — E559 Vitamin D deficiency, unspecified: Secondary | ICD-10-CM | POA: Diagnosis not present

## 2016-02-20 DIAGNOSIS — Z1231 Encounter for screening mammogram for malignant neoplasm of breast: Secondary | ICD-10-CM | POA: Diagnosis not present

## 2016-02-20 DIAGNOSIS — Z0000 Encounter for general adult medical examination without abnormal findings: Secondary | ICD-10-CM | POA: Diagnosis not present

## 2016-02-20 DIAGNOSIS — E784 Other hyperlipidemia: Secondary | ICD-10-CM | POA: Diagnosis not present

## 2016-02-20 DIAGNOSIS — D538 Other specified nutritional anemias: Secondary | ICD-10-CM | POA: Diagnosis not present

## 2016-02-20 DIAGNOSIS — M810 Age-related osteoporosis without current pathological fracture: Secondary | ICD-10-CM | POA: Diagnosis not present

## 2016-02-21 DIAGNOSIS — R69 Illness, unspecified: Secondary | ICD-10-CM | POA: Diagnosis not present

## 2016-02-24 DIAGNOSIS — J449 Chronic obstructive pulmonary disease, unspecified: Secondary | ICD-10-CM | POA: Diagnosis not present

## 2016-02-27 DIAGNOSIS — Z1212 Encounter for screening for malignant neoplasm of rectum: Secondary | ICD-10-CM | POA: Diagnosis not present

## 2016-02-27 DIAGNOSIS — G4733 Obstructive sleep apnea (adult) (pediatric): Secondary | ICD-10-CM | POA: Diagnosis not present

## 2016-03-24 DIAGNOSIS — J45998 Other asthma: Secondary | ICD-10-CM | POA: Diagnosis not present

## 2016-03-24 DIAGNOSIS — J449 Chronic obstructive pulmonary disease, unspecified: Secondary | ICD-10-CM | POA: Diagnosis not present

## 2016-03-28 DIAGNOSIS — G4733 Obstructive sleep apnea (adult) (pediatric): Secondary | ICD-10-CM | POA: Diagnosis not present

## 2016-04-02 DIAGNOSIS — G4733 Obstructive sleep apnea (adult) (pediatric): Secondary | ICD-10-CM | POA: Diagnosis not present

## 2016-04-02 DIAGNOSIS — J449 Chronic obstructive pulmonary disease, unspecified: Secondary | ICD-10-CM | POA: Diagnosis not present

## 2016-04-02 DIAGNOSIS — I10 Essential (primary) hypertension: Secondary | ICD-10-CM | POA: Diagnosis not present

## 2016-04-21 DIAGNOSIS — J449 Chronic obstructive pulmonary disease, unspecified: Secondary | ICD-10-CM | POA: Diagnosis not present

## 2016-04-28 DIAGNOSIS — G4733 Obstructive sleep apnea (adult) (pediatric): Secondary | ICD-10-CM | POA: Diagnosis not present

## 2016-05-07 DIAGNOSIS — Z794 Long term (current) use of insulin: Secondary | ICD-10-CM | POA: Diagnosis not present

## 2016-05-07 DIAGNOSIS — I10 Essential (primary) hypertension: Secondary | ICD-10-CM | POA: Diagnosis not present

## 2016-05-07 DIAGNOSIS — E1142 Type 2 diabetes mellitus with diabetic polyneuropathy: Secondary | ICD-10-CM | POA: Diagnosis not present

## 2016-05-07 DIAGNOSIS — E1165 Type 2 diabetes mellitus with hyperglycemia: Secondary | ICD-10-CM | POA: Diagnosis not present

## 2016-05-07 DIAGNOSIS — E785 Hyperlipidemia, unspecified: Secondary | ICD-10-CM | POA: Diagnosis not present

## 2016-05-11 DIAGNOSIS — B351 Tinea unguium: Secondary | ICD-10-CM | POA: Diagnosis not present

## 2016-05-11 DIAGNOSIS — L84 Corns and callosities: Secondary | ICD-10-CM | POA: Diagnosis not present

## 2016-05-11 DIAGNOSIS — E1149 Type 2 diabetes mellitus with other diabetic neurological complication: Secondary | ICD-10-CM | POA: Diagnosis not present

## 2016-05-18 DIAGNOSIS — J449 Chronic obstructive pulmonary disease, unspecified: Secondary | ICD-10-CM | POA: Diagnosis not present

## 2016-05-29 DIAGNOSIS — G4733 Obstructive sleep apnea (adult) (pediatric): Secondary | ICD-10-CM | POA: Diagnosis not present

## 2016-06-19 DIAGNOSIS — J449 Chronic obstructive pulmonary disease, unspecified: Secondary | ICD-10-CM | POA: Diagnosis not present

## 2016-06-20 IMAGING — DX DG CHEST 1V PORT
1 series · 1 of 1 positions shown · non-contrast
Comparison: 03/13/2013

CLINICAL DATA: 69-year-old female with shortness of breath for 4
days and productive cough. Former smoker. Initial encounter.

EXAM:
PORTABLE CHEST 1 VIEW

[chest ap]
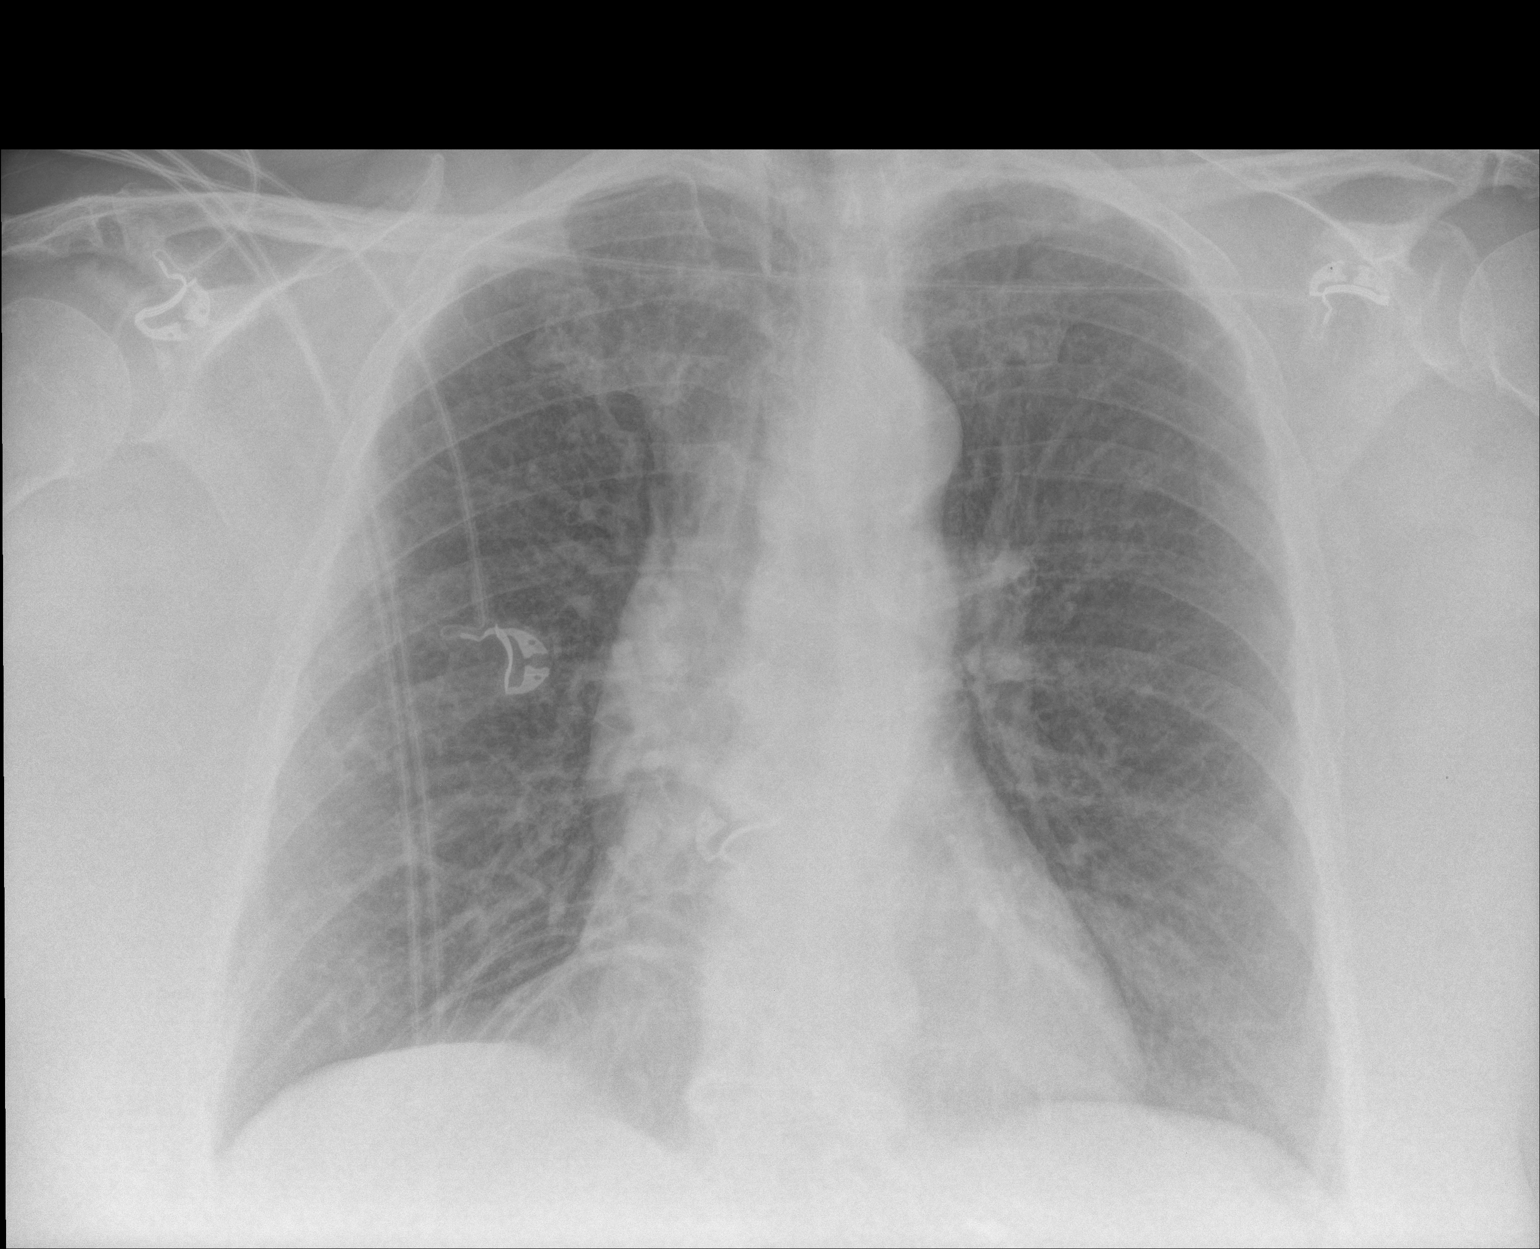

[1 of 1 positions shown; findings below may reference images not displayed]

FINDINGS: Portable AP semi upright view at 6491 hours. A degree of chronic
pulmonary hyperinflation is suspected. Stable cardiac size and
mediastinal contours. Visualized tracheal air column is within
normal limits. Allowing for portable technique, the lungs are clear.
No pneumothorax identified.
IMPRESSION: No acute cardiopulmonary abnormality.

## 2016-06-26 DIAGNOSIS — G4733 Obstructive sleep apnea (adult) (pediatric): Secondary | ICD-10-CM | POA: Diagnosis not present

## 2016-07-06 DIAGNOSIS — J449 Chronic obstructive pulmonary disease, unspecified: Secondary | ICD-10-CM | POA: Diagnosis not present

## 2016-07-06 DIAGNOSIS — I10 Essential (primary) hypertension: Secondary | ICD-10-CM | POA: Diagnosis not present

## 2016-07-06 DIAGNOSIS — G4733 Obstructive sleep apnea (adult) (pediatric): Secondary | ICD-10-CM | POA: Diagnosis not present

## 2016-07-20 DIAGNOSIS — J449 Chronic obstructive pulmonary disease, unspecified: Secondary | ICD-10-CM | POA: Diagnosis not present

## 2016-07-27 DIAGNOSIS — G4733 Obstructive sleep apnea (adult) (pediatric): Secondary | ICD-10-CM | POA: Diagnosis not present

## 2016-07-29 DIAGNOSIS — I10 Essential (primary) hypertension: Secondary | ICD-10-CM | POA: Diagnosis not present

## 2016-07-29 DIAGNOSIS — G4733 Obstructive sleep apnea (adult) (pediatric): Secondary | ICD-10-CM | POA: Diagnosis not present

## 2016-07-29 DIAGNOSIS — J449 Chronic obstructive pulmonary disease, unspecified: Secondary | ICD-10-CM | POA: Diagnosis not present

## 2016-08-05 DIAGNOSIS — E1165 Type 2 diabetes mellitus with hyperglycemia: Secondary | ICD-10-CM | POA: Diagnosis not present

## 2016-08-05 DIAGNOSIS — E1142 Type 2 diabetes mellitus with diabetic polyneuropathy: Secondary | ICD-10-CM | POA: Diagnosis not present

## 2016-08-05 DIAGNOSIS — I10 Essential (primary) hypertension: Secondary | ICD-10-CM | POA: Diagnosis not present

## 2016-08-05 DIAGNOSIS — E785 Hyperlipidemia, unspecified: Secondary | ICD-10-CM | POA: Diagnosis not present

## 2016-08-05 DIAGNOSIS — Z794 Long term (current) use of insulin: Secondary | ICD-10-CM | POA: Diagnosis not present

## 2016-08-10 DIAGNOSIS — E1142 Type 2 diabetes mellitus with diabetic polyneuropathy: Secondary | ICD-10-CM | POA: Diagnosis not present

## 2016-08-10 DIAGNOSIS — L84 Corns and callosities: Secondary | ICD-10-CM | POA: Diagnosis not present

## 2016-08-10 DIAGNOSIS — E1151 Type 2 diabetes mellitus with diabetic peripheral angiopathy without gangrene: Secondary | ICD-10-CM | POA: Diagnosis not present

## 2016-08-10 DIAGNOSIS — B351 Tinea unguium: Secondary | ICD-10-CM | POA: Diagnosis not present

## 2016-08-10 DIAGNOSIS — L602 Onychogryphosis: Secondary | ICD-10-CM | POA: Diagnosis not present

## 2016-08-19 DIAGNOSIS — J449 Chronic obstructive pulmonary disease, unspecified: Secondary | ICD-10-CM | POA: Diagnosis not present

## 2016-08-19 DIAGNOSIS — K5792 Diverticulitis of intestine, part unspecified, without perforation or abscess without bleeding: Secondary | ICD-10-CM | POA: Diagnosis not present

## 2016-08-26 DIAGNOSIS — G4733 Obstructive sleep apnea (adult) (pediatric): Secondary | ICD-10-CM | POA: Diagnosis not present

## 2016-09-14 DIAGNOSIS — J449 Chronic obstructive pulmonary disease, unspecified: Secondary | ICD-10-CM | POA: Diagnosis not present

## 2016-09-26 DIAGNOSIS — G4733 Obstructive sleep apnea (adult) (pediatric): Secondary | ICD-10-CM | POA: Diagnosis not present

## 2016-10-19 DIAGNOSIS — J449 Chronic obstructive pulmonary disease, unspecified: Secondary | ICD-10-CM | POA: Diagnosis not present

## 2016-10-19 DIAGNOSIS — J45998 Other asthma: Secondary | ICD-10-CM | POA: Diagnosis not present

## 2016-10-26 DIAGNOSIS — G4733 Obstructive sleep apnea (adult) (pediatric): Secondary | ICD-10-CM | POA: Diagnosis not present

## 2016-11-09 DIAGNOSIS — B351 Tinea unguium: Secondary | ICD-10-CM | POA: Diagnosis not present

## 2016-11-09 DIAGNOSIS — E1142 Type 2 diabetes mellitus with diabetic polyneuropathy: Secondary | ICD-10-CM | POA: Diagnosis not present

## 2016-11-09 DIAGNOSIS — E1151 Type 2 diabetes mellitus with diabetic peripheral angiopathy without gangrene: Secondary | ICD-10-CM | POA: Diagnosis not present

## 2016-11-09 DIAGNOSIS — L602 Onychogryphosis: Secondary | ICD-10-CM | POA: Diagnosis not present

## 2016-11-11 DIAGNOSIS — E119 Type 2 diabetes mellitus without complications: Secondary | ICD-10-CM | POA: Diagnosis not present

## 2016-11-11 DIAGNOSIS — Z6841 Body Mass Index (BMI) 40.0 and over, adult: Secondary | ICD-10-CM | POA: Diagnosis not present

## 2016-11-11 DIAGNOSIS — J449 Chronic obstructive pulmonary disease, unspecified: Secondary | ICD-10-CM | POA: Diagnosis not present

## 2016-11-11 DIAGNOSIS — J45998 Other asthma: Secondary | ICD-10-CM | POA: Diagnosis not present

## 2016-11-11 DIAGNOSIS — I10 Essential (primary) hypertension: Secondary | ICD-10-CM | POA: Diagnosis not present

## 2016-11-11 DIAGNOSIS — G4733 Obstructive sleep apnea (adult) (pediatric): Secondary | ICD-10-CM | POA: Diagnosis not present

## 2016-11-11 DIAGNOSIS — D126 Benign neoplasm of colon, unspecified: Secondary | ICD-10-CM | POA: Diagnosis not present

## 2016-11-11 DIAGNOSIS — Z1389 Encounter for screening for other disorder: Secondary | ICD-10-CM | POA: Diagnosis not present

## 2016-11-11 DIAGNOSIS — J441 Chronic obstructive pulmonary disease with (acute) exacerbation: Secondary | ICD-10-CM | POA: Diagnosis not present

## 2016-11-17 DIAGNOSIS — J449 Chronic obstructive pulmonary disease, unspecified: Secondary | ICD-10-CM | POA: Diagnosis not present

## 2016-11-17 DIAGNOSIS — H40013 Open angle with borderline findings, low risk, bilateral: Secondary | ICD-10-CM | POA: Diagnosis not present

## 2016-11-18 DIAGNOSIS — Z0100 Encounter for examination of eyes and vision without abnormal findings: Secondary | ICD-10-CM | POA: Diagnosis not present

## 2016-11-26 DIAGNOSIS — G4733 Obstructive sleep apnea (adult) (pediatric): Secondary | ICD-10-CM | POA: Diagnosis not present

## 2016-12-15 DIAGNOSIS — J449 Chronic obstructive pulmonary disease, unspecified: Secondary | ICD-10-CM | POA: Diagnosis not present

## 2016-12-27 DIAGNOSIS — G4733 Obstructive sleep apnea (adult) (pediatric): Secondary | ICD-10-CM | POA: Diagnosis not present

## 2017-01-05 ENCOUNTER — Other Ambulatory Visit: Payer: Self-pay | Admitting: Gastroenterology

## 2017-01-05 DIAGNOSIS — Z86010 Personal history of colonic polyps: Secondary | ICD-10-CM

## 2017-01-13 DIAGNOSIS — J449 Chronic obstructive pulmonary disease, unspecified: Secondary | ICD-10-CM | POA: Diagnosis not present

## 2017-01-26 DIAGNOSIS — G4733 Obstructive sleep apnea (adult) (pediatric): Secondary | ICD-10-CM | POA: Diagnosis not present

## 2017-01-28 DIAGNOSIS — J449 Chronic obstructive pulmonary disease, unspecified: Secondary | ICD-10-CM | POA: Diagnosis not present

## 2017-01-28 DIAGNOSIS — G4733 Obstructive sleep apnea (adult) (pediatric): Secondary | ICD-10-CM | POA: Diagnosis not present

## 2017-01-28 DIAGNOSIS — I10 Essential (primary) hypertension: Secondary | ICD-10-CM | POA: Diagnosis not present

## 2017-02-06 DIAGNOSIS — Z23 Encounter for immunization: Secondary | ICD-10-CM | POA: Diagnosis not present

## 2017-02-10 DIAGNOSIS — J449 Chronic obstructive pulmonary disease, unspecified: Secondary | ICD-10-CM | POA: Diagnosis not present

## 2017-02-10 DIAGNOSIS — J45998 Other asthma: Secondary | ICD-10-CM | POA: Diagnosis not present

## 2017-02-19 DIAGNOSIS — G4733 Obstructive sleep apnea (adult) (pediatric): Secondary | ICD-10-CM | POA: Diagnosis not present

## 2017-02-19 DIAGNOSIS — I10 Essential (primary) hypertension: Secondary | ICD-10-CM | POA: Diagnosis not present

## 2017-02-19 DIAGNOSIS — J449 Chronic obstructive pulmonary disease, unspecified: Secondary | ICD-10-CM | POA: Diagnosis not present

## 2017-02-26 DIAGNOSIS — G4733 Obstructive sleep apnea (adult) (pediatric): Secondary | ICD-10-CM | POA: Diagnosis not present

## 2017-02-26 DIAGNOSIS — Z1231 Encounter for screening mammogram for malignant neoplasm of breast: Secondary | ICD-10-CM | POA: Diagnosis not present

## 2017-02-26 DIAGNOSIS — J449 Chronic obstructive pulmonary disease, unspecified: Secondary | ICD-10-CM | POA: Diagnosis not present

## 2017-03-05 ENCOUNTER — Ambulatory Visit
Admission: RE | Admit: 2017-03-05 | Discharge: 2017-03-05 | Disposition: A | Discharge status: Home / Self Care | Payer: Medicare HMO | Source: Ambulatory Visit | Attending: Gastroenterology | Admitting: Gastroenterology

## 2017-03-05 ENCOUNTER — Other Ambulatory Visit: Payer: Self-pay | Admitting: Gastroenterology

## 2017-03-05 DIAGNOSIS — K5730 Diverticulosis of large intestine without perforation or abscess without bleeding: Secondary | ICD-10-CM | POA: Diagnosis not present

## 2017-03-05 DIAGNOSIS — Z86010 Personal history of colonic polyps: Secondary | ICD-10-CM

## 2017-03-12 ENCOUNTER — Telehealth: Payer: Self-pay | Admitting: Pulmonary Disease

## 2017-03-12 NOTE — Telephone Encounter (Signed)
Spoke with [employer]. She stated that she received paperwork from patient's PCP office that did not state she was using oxygen. Wylie Hail that the patient has not been seen in our office since 2015.   Athena verbalized understanding. Nothing else needed at time of call.

## 2017-03-15 DIAGNOSIS — L84 Corns and callosities: Secondary | ICD-10-CM | POA: Diagnosis not present

## 2017-03-15 DIAGNOSIS — E1142 Type 2 diabetes mellitus with diabetic polyneuropathy: Secondary | ICD-10-CM | POA: Diagnosis not present

## 2017-03-15 DIAGNOSIS — E1151 Type 2 diabetes mellitus with diabetic peripheral angiopathy without gangrene: Secondary | ICD-10-CM | POA: Diagnosis not present

## 2017-03-15 DIAGNOSIS — B351 Tinea unguium: Secondary | ICD-10-CM | POA: Diagnosis not present

## 2017-03-28 DIAGNOSIS — J449 Chronic obstructive pulmonary disease, unspecified: Secondary | ICD-10-CM | POA: Diagnosis not present

## 2017-03-28 DIAGNOSIS — G4733 Obstructive sleep apnea (adult) (pediatric): Secondary | ICD-10-CM | POA: Diagnosis not present

## 2017-04-13 DIAGNOSIS — J449 Chronic obstructive pulmonary disease, unspecified: Secondary | ICD-10-CM | POA: Diagnosis not present

## 2017-04-16 DIAGNOSIS — E119 Type 2 diabetes mellitus without complications: Secondary | ICD-10-CM | POA: Diagnosis not present

## 2017-04-16 DIAGNOSIS — D539 Nutritional anemia, unspecified: Secondary | ICD-10-CM | POA: Diagnosis not present

## 2017-04-16 DIAGNOSIS — R82998 Other abnormal findings in urine: Secondary | ICD-10-CM | POA: Diagnosis not present

## 2017-04-16 DIAGNOSIS — E7849 Other hyperlipidemia: Secondary | ICD-10-CM | POA: Diagnosis not present

## 2017-04-16 DIAGNOSIS — D538 Other specified nutritional anemias: Secondary | ICD-10-CM | POA: Diagnosis not present

## 2017-04-16 DIAGNOSIS — E559 Vitamin D deficiency, unspecified: Secondary | ICD-10-CM | POA: Diagnosis not present

## 2017-04-26 DIAGNOSIS — Z0000 Encounter for general adult medical examination without abnormal findings: Secondary | ICD-10-CM | POA: Diagnosis not present

## 2017-04-26 DIAGNOSIS — J45998 Other asthma: Secondary | ICD-10-CM | POA: Diagnosis not present

## 2017-04-26 DIAGNOSIS — J441 Chronic obstructive pulmonary disease with (acute) exacerbation: Secondary | ICD-10-CM | POA: Diagnosis not present

## 2017-04-26 DIAGNOSIS — G608 Other hereditary and idiopathic neuropathies: Secondary | ICD-10-CM | POA: Diagnosis not present

## 2017-04-26 DIAGNOSIS — D538 Other specified nutritional anemias: Secondary | ICD-10-CM | POA: Diagnosis not present

## 2017-04-26 DIAGNOSIS — E119 Type 2 diabetes mellitus without complications: Secondary | ICD-10-CM | POA: Diagnosis not present

## 2017-04-26 DIAGNOSIS — M810 Age-related osteoporosis without current pathological fracture: Secondary | ICD-10-CM | POA: Diagnosis not present

## 2017-04-26 DIAGNOSIS — I878 Other specified disorders of veins: Secondary | ICD-10-CM | POA: Diagnosis not present

## 2017-04-26 DIAGNOSIS — R808 Other proteinuria: Secondary | ICD-10-CM | POA: Diagnosis not present

## 2017-04-28 DIAGNOSIS — G4733 Obstructive sleep apnea (adult) (pediatric): Secondary | ICD-10-CM | POA: Diagnosis not present

## 2017-04-28 DIAGNOSIS — J449 Chronic obstructive pulmonary disease, unspecified: Secondary | ICD-10-CM | POA: Diagnosis not present

## 2017-05-12 DIAGNOSIS — J449 Chronic obstructive pulmonary disease, unspecified: Secondary | ICD-10-CM | POA: Diagnosis not present

## 2017-05-29 DIAGNOSIS — J449 Chronic obstructive pulmonary disease, unspecified: Secondary | ICD-10-CM | POA: Diagnosis not present

## 2017-05-29 DIAGNOSIS — G4733 Obstructive sleep apnea (adult) (pediatric): Secondary | ICD-10-CM | POA: Diagnosis not present

## 2017-06-14 DIAGNOSIS — J449 Chronic obstructive pulmonary disease, unspecified: Secondary | ICD-10-CM | POA: Diagnosis not present

## 2017-06-17 DIAGNOSIS — E1142 Type 2 diabetes mellitus with diabetic polyneuropathy: Secondary | ICD-10-CM | POA: Diagnosis not present

## 2017-06-17 DIAGNOSIS — B351 Tinea unguium: Secondary | ICD-10-CM | POA: Diagnosis not present

## 2017-06-17 DIAGNOSIS — E1151 Type 2 diabetes mellitus with diabetic peripheral angiopathy without gangrene: Secondary | ICD-10-CM | POA: Diagnosis not present

## 2017-06-17 DIAGNOSIS — E1149 Type 2 diabetes mellitus with other diabetic neurological complication: Secondary | ICD-10-CM | POA: Diagnosis not present

## 2017-06-26 DIAGNOSIS — G4733 Obstructive sleep apnea (adult) (pediatric): Secondary | ICD-10-CM | POA: Diagnosis not present

## 2017-06-26 DIAGNOSIS — J449 Chronic obstructive pulmonary disease, unspecified: Secondary | ICD-10-CM | POA: Diagnosis not present

## 2017-07-12 DIAGNOSIS — J449 Chronic obstructive pulmonary disease, unspecified: Secondary | ICD-10-CM | POA: Diagnosis not present

## 2017-07-27 DIAGNOSIS — G4733 Obstructive sleep apnea (adult) (pediatric): Secondary | ICD-10-CM | POA: Diagnosis not present

## 2017-07-27 DIAGNOSIS — J449 Chronic obstructive pulmonary disease, unspecified: Secondary | ICD-10-CM | POA: Diagnosis not present

## 2017-07-29 DIAGNOSIS — I10 Essential (primary) hypertension: Secondary | ICD-10-CM | POA: Diagnosis not present

## 2017-07-29 DIAGNOSIS — G4733 Obstructive sleep apnea (adult) (pediatric): Secondary | ICD-10-CM | POA: Diagnosis not present

## 2017-07-29 DIAGNOSIS — J449 Chronic obstructive pulmonary disease, unspecified: Secondary | ICD-10-CM | POA: Diagnosis not present

## 2017-08-12 DIAGNOSIS — J449 Chronic obstructive pulmonary disease, unspecified: Secondary | ICD-10-CM | POA: Diagnosis not present

## 2017-08-26 DIAGNOSIS — J449 Chronic obstructive pulmonary disease, unspecified: Secondary | ICD-10-CM | POA: Diagnosis not present

## 2017-08-26 DIAGNOSIS — G4733 Obstructive sleep apnea (adult) (pediatric): Secondary | ICD-10-CM | POA: Diagnosis not present

## 2017-09-07 DIAGNOSIS — G4733 Obstructive sleep apnea (adult) (pediatric): Secondary | ICD-10-CM | POA: Diagnosis not present

## 2017-09-07 DIAGNOSIS — J449 Chronic obstructive pulmonary disease, unspecified: Secondary | ICD-10-CM | POA: Diagnosis not present

## 2017-09-07 DIAGNOSIS — I10 Essential (primary) hypertension: Secondary | ICD-10-CM | POA: Diagnosis not present

## 2017-09-13 DIAGNOSIS — J449 Chronic obstructive pulmonary disease, unspecified: Secondary | ICD-10-CM | POA: Diagnosis not present

## 2017-09-26 DIAGNOSIS — J449 Chronic obstructive pulmonary disease, unspecified: Secondary | ICD-10-CM | POA: Diagnosis not present

## 2017-09-26 DIAGNOSIS — G4733 Obstructive sleep apnea (adult) (pediatric): Secondary | ICD-10-CM | POA: Diagnosis not present

## 2017-10-13 DIAGNOSIS — J449 Chronic obstructive pulmonary disease, unspecified: Secondary | ICD-10-CM | POA: Diagnosis not present

## 2017-10-14 DIAGNOSIS — B351 Tinea unguium: Secondary | ICD-10-CM | POA: Diagnosis not present

## 2017-10-14 DIAGNOSIS — E1142 Type 2 diabetes mellitus with diabetic polyneuropathy: Secondary | ICD-10-CM | POA: Diagnosis not present

## 2017-10-26 DIAGNOSIS — G4733 Obstructive sleep apnea (adult) (pediatric): Secondary | ICD-10-CM | POA: Diagnosis not present

## 2017-10-26 DIAGNOSIS — J449 Chronic obstructive pulmonary disease, unspecified: Secondary | ICD-10-CM | POA: Diagnosis not present

## 2017-11-12 DIAGNOSIS — J449 Chronic obstructive pulmonary disease, unspecified: Secondary | ICD-10-CM | POA: Diagnosis not present

## 2017-11-26 DIAGNOSIS — J449 Chronic obstructive pulmonary disease, unspecified: Secondary | ICD-10-CM | POA: Diagnosis not present

## 2017-11-26 DIAGNOSIS — G4733 Obstructive sleep apnea (adult) (pediatric): Secondary | ICD-10-CM | POA: Diagnosis not present

## 2017-12-09 DIAGNOSIS — G4733 Obstructive sleep apnea (adult) (pediatric): Secondary | ICD-10-CM | POA: Diagnosis not present

## 2017-12-09 DIAGNOSIS — J449 Chronic obstructive pulmonary disease, unspecified: Secondary | ICD-10-CM | POA: Diagnosis not present

## 2017-12-09 DIAGNOSIS — I10 Essential (primary) hypertension: Secondary | ICD-10-CM | POA: Diagnosis not present

## 2017-12-10 DIAGNOSIS — H40013 Open angle with borderline findings, low risk, bilateral: Secondary | ICD-10-CM | POA: Diagnosis not present

## 2017-12-10 DIAGNOSIS — H25013 Cortical age-related cataract, bilateral: Secondary | ICD-10-CM | POA: Diagnosis not present

## 2017-12-10 DIAGNOSIS — H2513 Age-related nuclear cataract, bilateral: Secondary | ICD-10-CM | POA: Diagnosis not present

## 2017-12-13 DIAGNOSIS — J449 Chronic obstructive pulmonary disease, unspecified: Secondary | ICD-10-CM | POA: Diagnosis not present

## 2017-12-23 DIAGNOSIS — G4733 Obstructive sleep apnea (adult) (pediatric): Secondary | ICD-10-CM | POA: Diagnosis not present

## 2017-12-23 DIAGNOSIS — J45998 Other asthma: Secondary | ICD-10-CM | POA: Diagnosis not present

## 2017-12-23 DIAGNOSIS — J441 Chronic obstructive pulmonary disease with (acute) exacerbation: Secondary | ICD-10-CM | POA: Diagnosis not present

## 2017-12-23 DIAGNOSIS — E1169 Type 2 diabetes mellitus with other specified complication: Secondary | ICD-10-CM | POA: Diagnosis not present

## 2017-12-23 DIAGNOSIS — G608 Other hereditary and idiopathic neuropathies: Secondary | ICD-10-CM | POA: Diagnosis not present

## 2017-12-23 DIAGNOSIS — Z6841 Body Mass Index (BMI) 40.0 and over, adult: Secondary | ICD-10-CM | POA: Diagnosis not present

## 2017-12-23 DIAGNOSIS — M810 Age-related osteoporosis without current pathological fracture: Secondary | ICD-10-CM | POA: Diagnosis not present

## 2017-12-23 DIAGNOSIS — I10 Essential (primary) hypertension: Secondary | ICD-10-CM | POA: Diagnosis not present

## 2017-12-24 ENCOUNTER — Encounter: Payer: Self-pay | Admitting: Neurology

## 2017-12-27 DIAGNOSIS — J449 Chronic obstructive pulmonary disease, unspecified: Secondary | ICD-10-CM | POA: Diagnosis not present

## 2017-12-27 DIAGNOSIS — G4733 Obstructive sleep apnea (adult) (pediatric): Secondary | ICD-10-CM | POA: Diagnosis not present

## 2017-12-30 DIAGNOSIS — H1089 Other conjunctivitis: Secondary | ICD-10-CM | POA: Diagnosis not present

## 2018-01-06 DIAGNOSIS — J449 Chronic obstructive pulmonary disease, unspecified: Secondary | ICD-10-CM | POA: Diagnosis not present

## 2018-01-14 DIAGNOSIS — H1089 Other conjunctivitis: Secondary | ICD-10-CM | POA: Diagnosis not present

## 2018-01-26 DIAGNOSIS — G4733 Obstructive sleep apnea (adult) (pediatric): Secondary | ICD-10-CM | POA: Diagnosis not present

## 2018-01-26 DIAGNOSIS — J449 Chronic obstructive pulmonary disease, unspecified: Secondary | ICD-10-CM | POA: Diagnosis not present

## 2018-01-28 DIAGNOSIS — I10 Essential (primary) hypertension: Secondary | ICD-10-CM | POA: Diagnosis not present

## 2018-01-28 DIAGNOSIS — J449 Chronic obstructive pulmonary disease, unspecified: Secondary | ICD-10-CM | POA: Diagnosis not present

## 2018-01-28 DIAGNOSIS — G4733 Obstructive sleep apnea (adult) (pediatric): Secondary | ICD-10-CM | POA: Diagnosis not present

## 2018-01-29 DIAGNOSIS — Z23 Encounter for immunization: Secondary | ICD-10-CM | POA: Diagnosis not present

## 2018-02-10 DIAGNOSIS — J449 Chronic obstructive pulmonary disease, unspecified: Secondary | ICD-10-CM | POA: Diagnosis not present

## 2018-02-10 DIAGNOSIS — B351 Tinea unguium: Secondary | ICD-10-CM | POA: Diagnosis not present

## 2018-02-10 DIAGNOSIS — L97511 Non-pressure chronic ulcer of other part of right foot limited to breakdown of skin: Secondary | ICD-10-CM | POA: Diagnosis not present

## 2018-02-24 DIAGNOSIS — L97512 Non-pressure chronic ulcer of other part of right foot with fat layer exposed: Secondary | ICD-10-CM | POA: Diagnosis not present

## 2018-02-24 DIAGNOSIS — E11621 Type 2 diabetes mellitus with foot ulcer: Secondary | ICD-10-CM | POA: Diagnosis not present

## 2018-02-26 DIAGNOSIS — G4733 Obstructive sleep apnea (adult) (pediatric): Secondary | ICD-10-CM | POA: Diagnosis not present

## 2018-02-26 DIAGNOSIS — J449 Chronic obstructive pulmonary disease, unspecified: Secondary | ICD-10-CM | POA: Diagnosis not present

## 2018-03-10 DIAGNOSIS — J449 Chronic obstructive pulmonary disease, unspecified: Secondary | ICD-10-CM | POA: Diagnosis not present

## 2018-03-15 DIAGNOSIS — H25013 Cortical age-related cataract, bilateral: Secondary | ICD-10-CM | POA: Diagnosis not present

## 2018-03-15 DIAGNOSIS — H2513 Age-related nuclear cataract, bilateral: Secondary | ICD-10-CM | POA: Diagnosis not present

## 2018-03-15 DIAGNOSIS — H2511 Age-related nuclear cataract, right eye: Secondary | ICD-10-CM | POA: Diagnosis not present

## 2018-03-15 DIAGNOSIS — H25043 Posterior subcapsular polar age-related cataract, bilateral: Secondary | ICD-10-CM | POA: Diagnosis not present

## 2018-03-15 DIAGNOSIS — H18413 Arcus senilis, bilateral: Secondary | ICD-10-CM | POA: Diagnosis not present

## 2018-03-15 DIAGNOSIS — H02834 Dermatochalasis of left upper eyelid: Secondary | ICD-10-CM | POA: Diagnosis not present

## 2018-03-17 DIAGNOSIS — L97512 Non-pressure chronic ulcer of other part of right foot with fat layer exposed: Secondary | ICD-10-CM | POA: Diagnosis not present

## 2018-03-17 DIAGNOSIS — M2041 Other hammer toe(s) (acquired), right foot: Secondary | ICD-10-CM | POA: Diagnosis not present

## 2018-03-17 DIAGNOSIS — G629 Polyneuropathy, unspecified: Secondary | ICD-10-CM | POA: Diagnosis not present

## 2018-03-24 IMAGING — RF DG BE W/ CM - WO/W KUB
1 series · 15 of 24 positions shown · IV contrast (agent unspecified)
Comparison: None.

CLINICAL DATA: History polyps.  Screening.

EXAM:
BE WITH CONTRAST - WITHOUT AND WITH KUB
CONTRAST:  Barium.
FLUOROSCOPY TIME:  Fluoroscopy Time:
Radiation Exposure Index (if provided by the fluoroscopic device):
5,174 mGy
Number of Acquired Spot Images: 0

[Series 1: one shot · 15 of 43 slices shown]
[im 1/43]
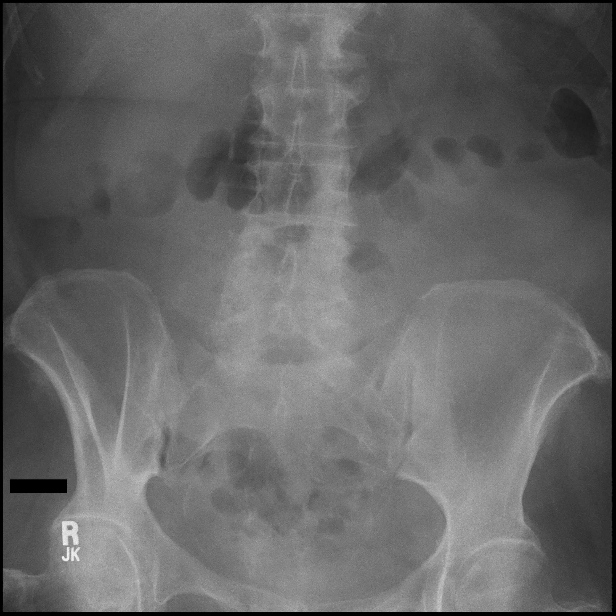
[im 4/43]
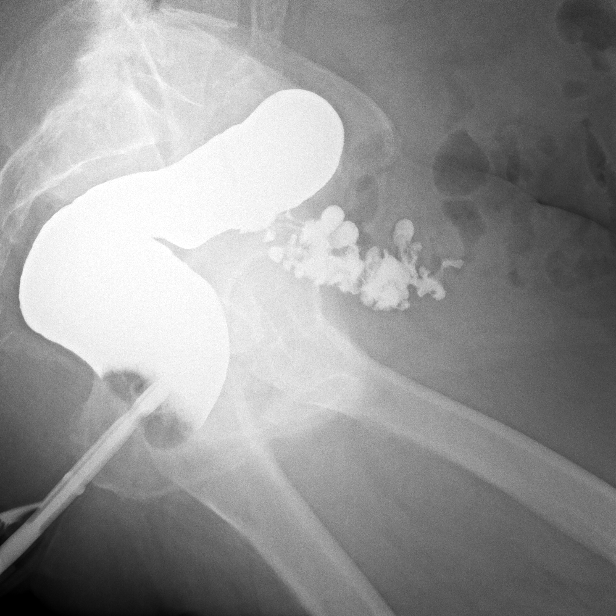
[im 8/43]
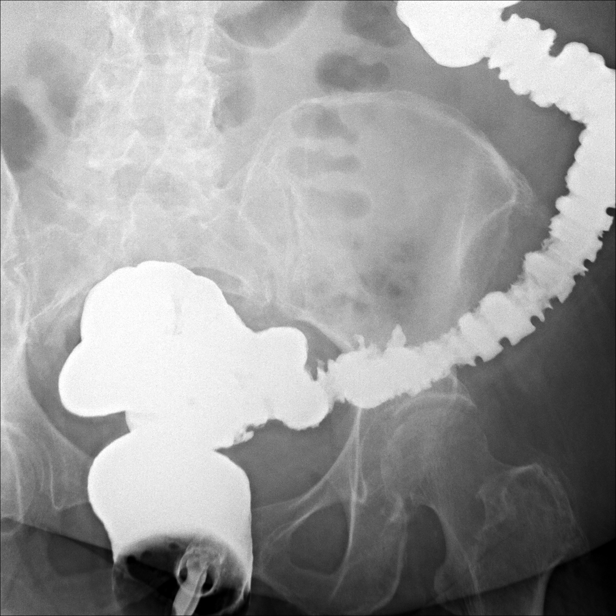
[im 10/43]
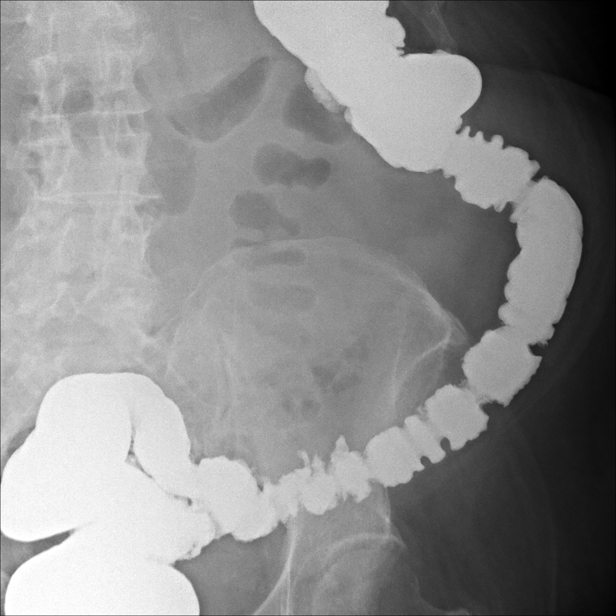
[im 13/43]
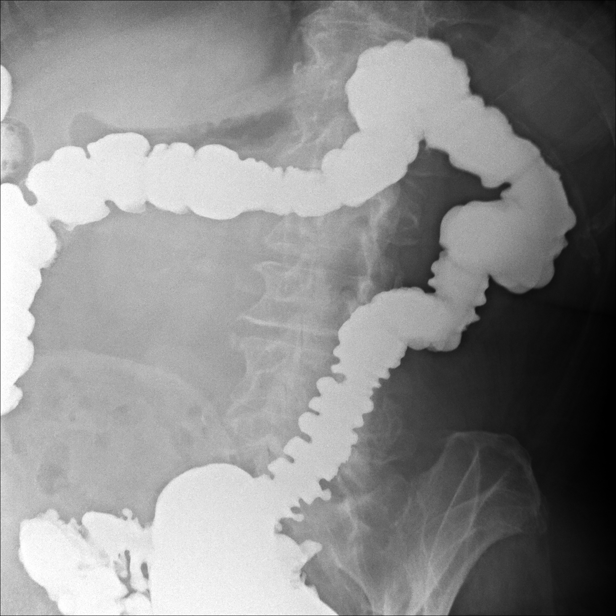
[im 15/43]
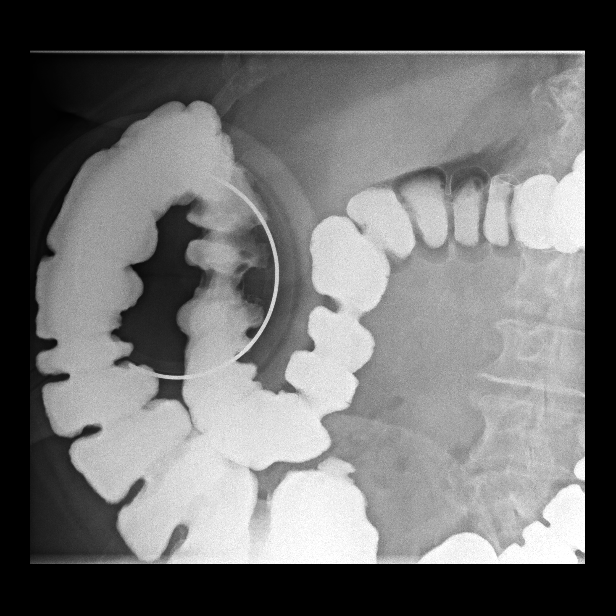
[im 19/43]
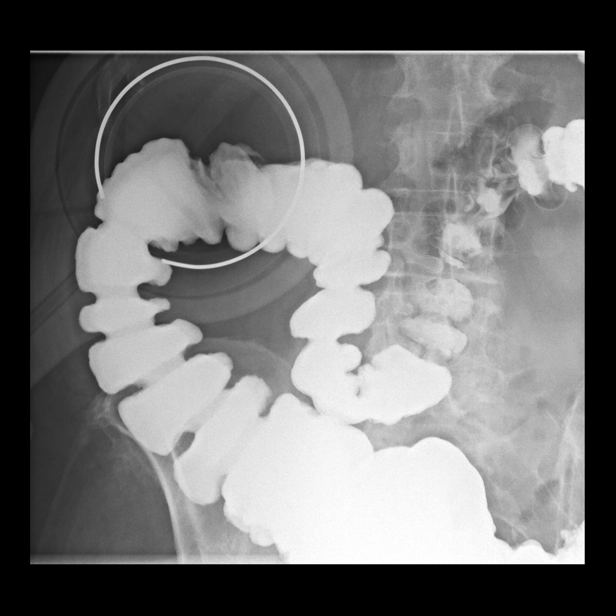
[im 22/43]
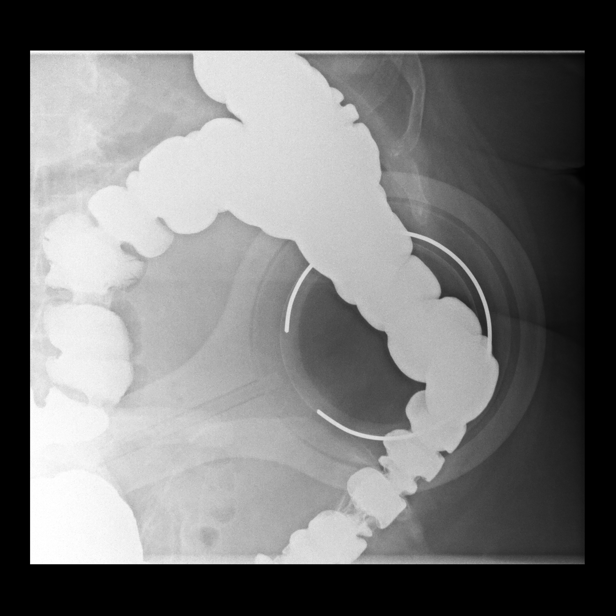
[im 24/43]
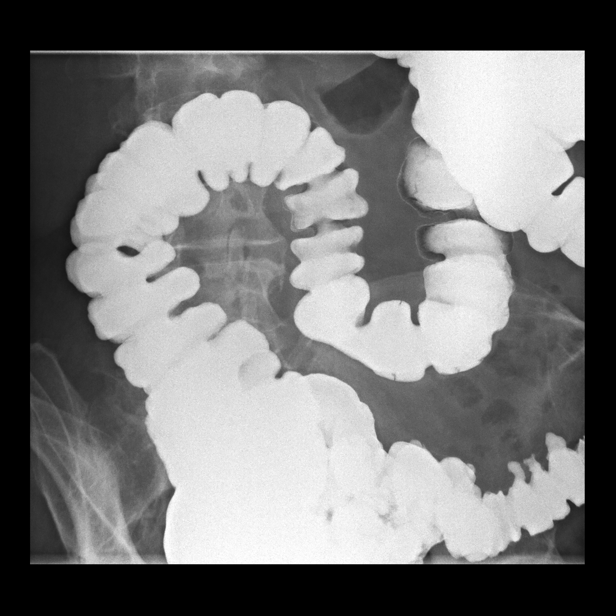
[im 28/43]
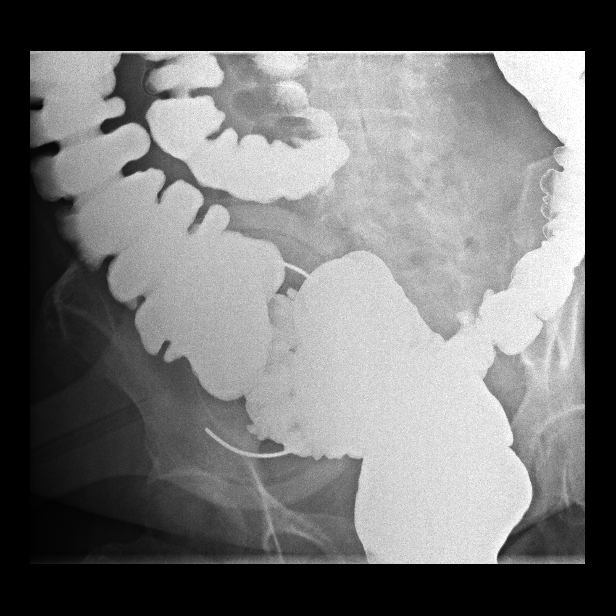
[im 30/43]
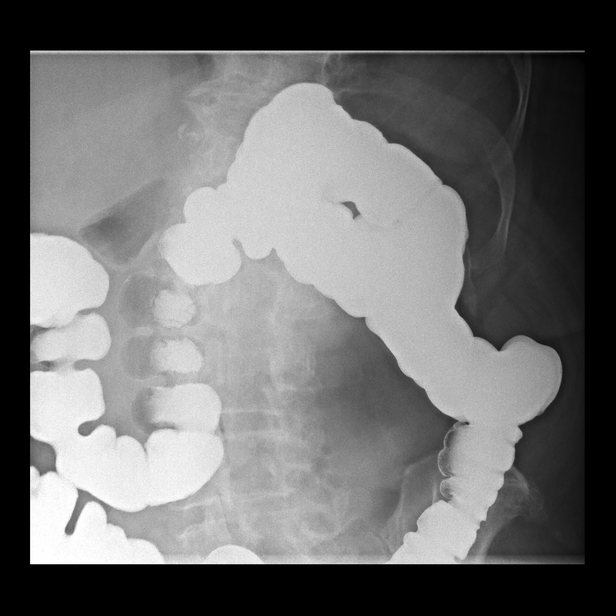
[im 33/43]
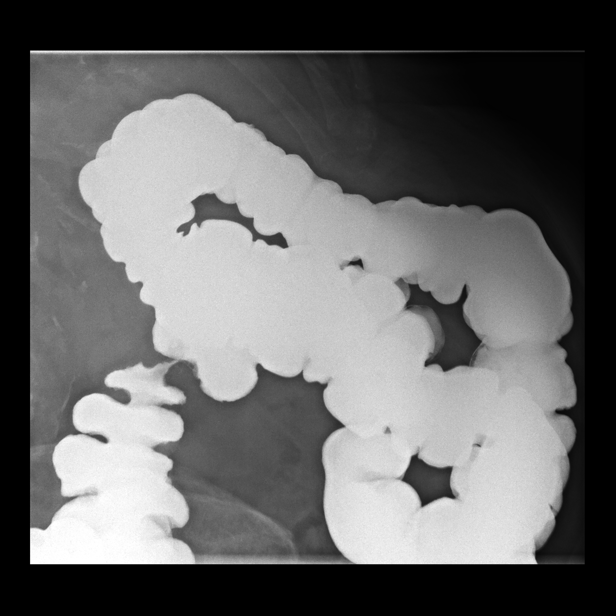
[im 37/43]
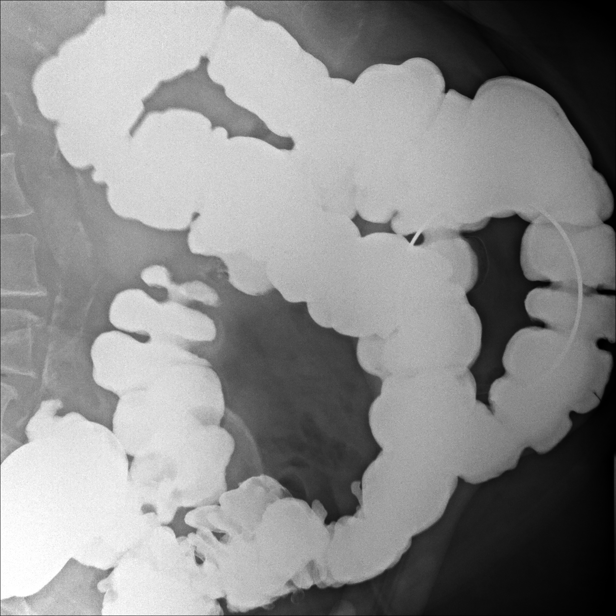
[im 39/43]
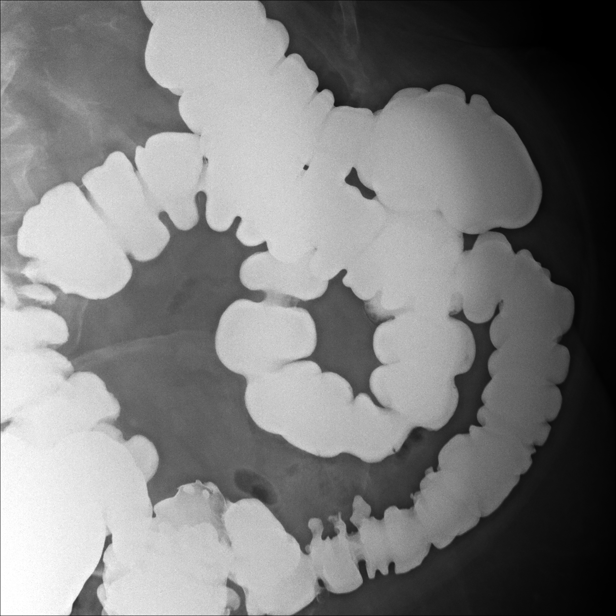
[im 43/43]
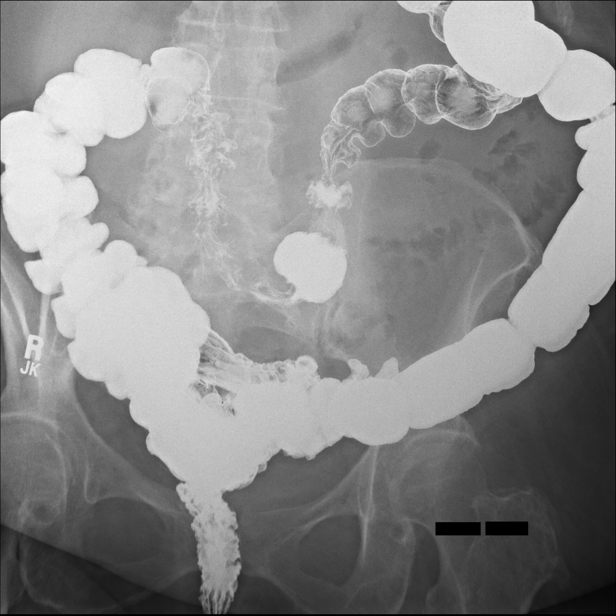

[15 of 24 positions shown; findings below may reference images not displayed]

FINDINGS: Pre-procedure KUB shows normal bowel gas pattern.

Very limited study due to patient's body habitus and immobility.
Single contrast imaging was performed. There is diverticular disease
of the sigmoid colon. Barium was refluxed retrograde to the level of
the cecal tip. Once colonic lumen was opacified, compression imaging
of the colon was performed. Areas of the distal sigmoid colon/
rectum and splenic flexure remained superimposed despite
repositioning as much as possible and vigorous compression. There is
no evidence for gross colonic mass lesion. No clinically significant
colonic polyp or mass is identified on single contrast imaging.
IMPRESSION: Limited single contrast barium enema reveals no gross colonic mass
lesion. No clinically significant colonic polyp or mass is detected.

Left colonic diverticulosis.

## 2018-03-28 ENCOUNTER — Other Ambulatory Visit (INDEPENDENT_AMBULATORY_CARE_PROVIDER_SITE_OTHER): Payer: Medicare HMO

## 2018-03-28 ENCOUNTER — Encounter: Payer: Self-pay | Admitting: Neurology

## 2018-03-28 ENCOUNTER — Ambulatory Visit: Payer: Medicare HMO | Admitting: Neurology

## 2018-03-28 VITALS — BP 148/78 | HR 92 | Ht 64.0 in | Wt 301.1 lb

## 2018-03-28 DIAGNOSIS — R278 Other lack of coordination: Secondary | ICD-10-CM

## 2018-03-28 DIAGNOSIS — G609 Hereditary and idiopathic neuropathy, unspecified: Secondary | ICD-10-CM

## 2018-03-28 DIAGNOSIS — G4733 Obstructive sleep apnea (adult) (pediatric): Secondary | ICD-10-CM | POA: Diagnosis not present

## 2018-03-28 DIAGNOSIS — J449 Chronic obstructive pulmonary disease, unspecified: Secondary | ICD-10-CM | POA: Diagnosis not present

## 2018-03-28 LAB — FOLATE: FOLATE: 10.3 ng/mL (ref >5.9)

## 2018-03-28 NOTE — Progress Notes (Signed)
[facility] Clinic Note - Initial Visit   Date: 03/28/18  ANAIH Hart MRN: [id number] DOB: June 02, 1945   Dear Dr. Joylene Draft:  Thank you for your kind referral of Cassandra Hart for consultation of neuropathy. Although her history is well known to you, please allow [name] to reiterate it for the purpose of our medical record. The patient was accompanied to the clinic by daughter who also provides collateral information.     History of Present Illness: Cassandra Hart is a 72 y.o. right-handed Caucasian female with hypertension, asthma, OA, OSA, COPD, and prediabetes presenting for evaluation of neuropathy.    Starting around 2010, she began having numbness in the toes, which has gradually moved up her feet into her legs and up to the knees. Sometimes, she has tingling and pulsating sensation of the feet.  She takes gabapentin 300mg  TID which provides relief and has some breakthrough pain every other day.  She had not suffered any falls.  She has been using a cane since the summer of 2019 to help with balance. She does not have leg or feet weakness.   Around 2016, she began having numbness in the fingertips and palms of the hands.  She had difficulty with fine motor tasks such as putting on jewelry and buttoning clothes.   No family history of neuropathy.  She does not drink alcohol. She had borderline diabetes, controlled with diet.   She has seen Dr. Brett Fairy in 2015 for OSA and neuropathy.  Neuropathy labs were normal. She takes vitamin B12 1057mcg daily.  Recent vitamin B12 and TSH was normal.    Out-side paper records, electronic medical record, and images have been reviewed where available and summarized as:  Lab Results  Component Value Date   TSH 4.160 03/07/2014   Lab Results  Component Value Date   HGBA1C 6.5 (H) 06/04/2015   Lab Results  Component Value Date   [id number] 629 03/07/2014      Past Medical History:  Diagnosis Date  . Chronic  obstructive asthma   . Emphysema lung ([place])   . Hyperlipidemia   . Hypertension   . Osteoporosis   . Periodic limb movement   . Sleep apnea    using CPAP  . Uterine fibroid     Past Surgical History:  Procedure Laterality Date  . APPENDECTOMY    . BILATERAL SALPINGOOPHORECTOMY  1998  . KNEE ARTHROSCOPY     left  . TOTAL ABDOMINAL HYSTERECTOMY  1998     Medications:  Outpatient Encounter Medications as of 03/28/2018  Medication Sig  . albuterol (PROVENTIL HFA;VENTOLIN HFA) 108 (90 Base) MCG/ACT inhaler Inhale 1 puff into the lungs every 6 (six) hours as needed for wheezing or shortness of breath.  Marland Kitchen amLODipine (NORVASC) 5 MG tablet Take 5 mg by mouth daily.  Marland Kitchen arformoterol (BROVANA) 15 MCG/2ML NEBU Take 15 mcg by nebulization 2 (two) times daily.  Marland Kitchen aspirin 81 MG tablet Take 81 mg by mouth daily.    . budesonide (PULMICORT) 0.5 MG/2ML nebulizer solution Take 0.5 mg by nebulization 2 (two) times daily.  . cholecalciferol (VITAMIN D) 1000 UNITS tablet Take 4,000 Units by mouth daily.   . cyanocobalamin 1000 MCG tablet Take 1,000 mcg by mouth daily.   . DUREZOL 0.05 % EMUL INSTILL 1 DROP INTO RIGHT EYE THREE TIMES DAILY  . fluticasone-salmeterol (ADVAIR HFA) 115-21 MCG/ACT inhaler Inhale into the lungs.  . furosemide (LASIX) 20 MG tablet   . gabapentin (NEURONTIN) 100  MG capsule Take 300 mg by mouth 3 (three) times daily.   Marland Kitchen ipratropium (ATROVENT) 0.02 % nebulizer solution Take 0.5 mg by nebulization 2 (two) times daily.  Marland Kitchen PROLENSA 0.07 % SOLN INSTILL 1 DROP INTO RIGHT EYE AT BEDTIME AS DIRECTED  . rosuvastatin (CRESTOR) 20 MG tablet Take 20 mg by mouth daily.  Marland Kitchen telmisartan-hydrochlorothiazide (MICARDIS HCT) 80-25 MG tablet Take 1 tablet by mouth daily.  Marland Kitchen tiotropium (SPIRIVA) 18 MCG inhalation capsule Place into inhaler and inhale.  . tobramycin-dexamethasone (TOBRADEX) ophthalmic solution INSTILL 1 DROP INTO EACH EYE 4 TIMES DAILY AS DIRECTED  . traMADol (ULTRAM) 50 MG tablet  Take by mouth.  . [DISCONTINUED] levofloxacin (LEVAQUIN) 750 MG tablet Take 1 tablet (750 mg total) by mouth at bedtime.  . [DISCONTINUED] predniSONE (DELTASONE) 10 MG tablet Take 6 tablets (60 mg total) by mouth daily with breakfast. And decrease by one tablet daily  . [DISCONTINUED] valsartan-hydrochlorothiazide (DIOVAN-HCT) 320-25 MG per tablet Take 1 tablet by mouth daily.     No facility-administered encounter medications on file as of 03/28/2018.      Allergies: No Known Allergies  Family History: Family History  Problem Relation Age of Onset  . Lung cancer Father   . Bone cancer Father   . Bowel Disease Mother   . Hypertension Sister     Social History: Social History   Tobacco Use  . Smoking status: Former Smoker    Packs/day: 1.00    Years: 20.00    Pack years: 20.00    Last attempt to quit: 04/27/1996    Years since quitting: 21.9  . Smokeless tobacco: Never Used  Substance Use Topics  . Alcohol use: No    Alcohol/week: 0.0 standard drinks  . Drug use: No   Social History   Social History Narrative   Right handed.  Married, 2 kids.  HS grad.  Caffeine 2 cups daily.     Lives with husband in a one story home.     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     Review of Systems:  CONSTITUTIONAL: No fevers, chills, night sweats, or weight loss.   EYES: No visual changes or eye pain ENT: No hearing changes.  No history of nose bleeds.   RESPIRATORY: No cough, wheezing +shortness of breath.   CARDIOVASCULAR: Negative for chest pain, and palpitations.   GI: Negative for abdominal discomfort, blood in stools or black stools.  No recent change in bowel habits.   GU:  No history of incontinence.   MUSCLOSKELETAL: No history of joint pain or swelling.  No myalgias.   SKIN: Negative for lesions, rash, and itching.   HEMATOLOGY/ONCOLOGY: Negative for prolonged bleeding, bruising easily, and swollen nodes.  No history of cancer.   ENDOCRINE: Negative  for cold or heat intolerance, polydipsia or goiter.   PSYCH:   No depression or anxiety symptoms.   NEURO: As Above.   Vital Signs:  BP (!) 148/78   Pulse 92   Ht 5\' 4"  (1.626 m)   Wt (!) 301 lb 2 oz (136.6 kg)   SpO2 92%   BMI 51.69 kg/m    General Medical Exam:   General:  Well appearing, comfortable.   Eyes/ENT: see cranial nerve examination.   Neck: No masses appreciated.  Full range of motion without tenderness.  No carotid bruits. Respiratory:  Clear to auscultation, good air entry bilaterally.   Cardiac:  Regular rate and rhythm, no murmur.   Extremities:  No deformities, edema, or skin discoloration.  Skin:  No rashes or lesions.  Neurological Exam: MENTAL STATUS including orientation to time, place, person, recent and remote memory, attention span and concentration, language, and fund of knowledge is normal.  Speech is not dysarthric.  CRANIAL NERVES: II:  No visual field defects.  Unremarkable fundi.   III-IV-VI: Pupils equal round and reactive to light.  Normal conjugate, extra-ocular eye movements in all directions of gaze.  No nystagmus.  No ptosis.   V:  Normal facial sensation.    VII:  Normal facial symmetry and movements.  VIII:  Normal hearing and vestibular function.   IX-X:  Normal palatal movement.   XI:  Normal shoulder shrug and head rotation.   XII:  Normal tongue strength and range of motion, no deviation or fasciculation.  MOTOR:  No atrophy, fasciculations or abnormal movements.  No pronator drift.  Tone is normal.    Right Upper Extremity:    Left Upper Extremity:    Deltoid  5/5   Deltoid  5/5   Biceps  5/5   Biceps  5/5   Triceps  5/5   Triceps  5/5   Wrist extensors  5/5   Wrist extensors  5/5   Wrist flexors  5/5   Wrist flexors  5/5   Finger extensors  5/5   Finger extensors  5/5   Finger flexors  5/5   Finger flexors  5/5   Dorsal interossei  5/5   Dorsal interossei  5/5   Abductor pollicis  5/5   Abductor pollicis  5/5   Tone  (Ashworth scale)  0  Tone (Ashworth scale)  0   Right Lower Extremity:    Left Lower Extremity:    Hip flexors  5/5   Hip flexors  5/5   Hip extensors  5/5   Hip extensors  5/5   Knee flexors  5/5   Knee flexors  5/5   Knee extensors  5/5   Knee extensors  5/5   Dorsiflexors  5/5   Dorsiflexors  5/5   Plantarflexors  5/5   Plantarflexors  5/5   Toe extensors  5/5   Toe extensors  5/5   Toe flexors  4/5   Toe flexors  4/5   Tone (Ashworth scale)  0  Tone (Ashworth scale)  0   MSRs:  Right                                                                 Left brachioradialis 1+  brachioradialis 1+  biceps 1+  biceps 1+  triceps 1+  triceps 1+  patellar trace  Patellar trace  ankle jerk 0  ankle jerk 0  plantar response down  plantar response down   SENSORY:  Vibration is intact at the knees and MCP, absent distal to ankles.  Pin prick and temperature is also reduced in a gradient pattern distal to knees bilaterally.  Sensation is relatively preserved in the hands.  Rhomberg sign is present.  COORDINATION/GAIT: Normal finger-to- nose-finger.  Intact rapid alternating movements bilaterally.  Gait is wide-based, assisted with cane.  She is unable to perform stressed or tandem gait.   IMPRESSION: Peripheral neuropathy, most likely idiopathic and to a lesser degree contributed by  prediabetes.  She had stocking-glove distribution of sensory loss and very mild distal weakness. I had extensive discussion with the patient regarding the pathogenesis, etiology, management, and natural course of neuropathy. Neuropathy tends to be slowly progressive, especially if a treatable etiology is not identified.  She had neuropathy labs checked in 2015 which were normal.  I will recheck a few additional labs for neuropathy including vitamin B1, folate, copper, and SPEP with IFE.  We also discussed the value of doing NCS/EMG of the arm and leg to assess severity, which she is not keen on pursuing due to needle  phobia.  In addition to assessing severity, NCS/EMG would distinguish between axonal and demyelinating neuropathy; however, it is very unlikely that she had demyelinating neuropathy given the slow course. If her symptoms get worse, we can certainly proceed with testing at that time.    I discussed that in the vast majority of cases, despite checking for reversible causes, we are unable to find the underlying etiology and management is symptomatic.  Her pain is well-controlled on gabapentin 300mg  TID.   She does not want to start balance therapy I stressed the importance of fall precautions and daily foot exam.  She was urged to use a rollator on uneven ground.  Return to clinic in 6 months.   Thank you for allowing me to participate in patient's care.  If I can answer any additional questions, I would be pleased to do so.    Sincerely,    Donika K. Posey Pronto, DO

## 2018-03-28 NOTE — Patient Instructions (Addendum)
Check labs  Please be sure you check your feet daily  Install handrails in your bathroom  Be extra careful walking uneven ground and consider using your rollator  If you would like to start balance therapy, please call my office  If your numbness gets worse, call my office to schedule nerve testing  It was great to see you today and I look forward to seeing you again in 6 months   [facility]  Preventing Falls in the [place] are common, often dreaded events in the lives of older people. Aside from the obvious injuries and even death that may result, falls can cause wide-ranging consequences including loss of independence, mental decline, decreased activity, and mobility. Younger people are also at risk of falling, especially those with chronic illnesses and fatigue.  Ways to reduce the risk for falling:  * Examine diet and medications. Warm foods and alcohol dilate blood vessels, which can lead to dizziness when standing. Sleep aids, antidepressants, and pain medications can also increase the likelihood of a fall.  * Get a vison exam. Poor vision, cataracts, and glaucoma increase the chances of falling.  * Check foot gear. Shoes should fit snugly and have a sturdy, nonskid sole and broad, low heel.  * Participate in a physician-approved exercise program to build and maintain muscle strength and improve balance and coordination.  * Increase vitamin D intake. Vitamin D improves muscle strength and increases the amount of calcium the body is able to absorb and deposit in bones.  How to prevent falls from common hazards:  * Floors - Remove all loose wires, cords, and throw rugs. Minimize clutter. Make sure rugs are anchored and smooth. Keep furniture in its usual place.  * Chairs - Use chairs with straight backs, armrests, and firm seats. Add firm cushions to existing pieces to add height.  * Bathroom - Install grab bars and non-skid tape in the tub or shower. Use a bathtub  transfer bench or a shower chair with a back support. Use an elevated toilet seat and/or safety rails to assist standing from a low surface. Do not use towel racks or bathroom tissue holders to help you stand.  * Lighting - Make sure halls, stairways, and entrances are well-lit. Install a night light in your bathroom or hallway. Make sure there is a light switch at the top and bottom of the staircase. Turn lights on if you get up in the middle of the night. Make sure lamps or light switches are within reach of the bed if you have to get up during the night.  * Kitchen - Install non-skid rubber mats near the sink and stove. Clean spills immediately. Store frequently used utensils, pots, and pans between waist and eye level. This helps prevent reaching and bending. Sit when getting things out of the lower cupboards.  * Living room / Virgin furniture with wide spaces in between, giving enough room to move around. Establish a route through the living room that gives you something to hold onto as you walk.  * Stairs - Make sure treads, rails, and rugs are secure. Install a rail on both sides of the stairs. If stairs are a threat, it might be helpful to arrange most of your activities on the lower level to reduce the number of times you must climb the stairs.  * Entrances and doorways - Install metal handles on the walls adjacent to the doorknobs of all doors to make it more  secure as you travel through the doorway.  Tips for maintaining balance:  * Keep at least one hand free at all times Try using a backpack or fanny pack to hold things rather than carrying them in your hands. Never carry objects in both hands when walking as this interferes with keeping your balance.  * Consciously lift your feet off the ground when walking. Shuffling and dragging of the feet is a common culprit in losing your balance.  * When trying to navigate turns, use a "U" technique of facing forward and making a wide turn,  rather than pivoting sharply.  * Try to stand with your feet shoulder-length apart. When your feet are close together for any length of time, you increase your risk of losing your balance and falling.  * Do one thing at a time. Do not try to walk and accomplish another task, such as reading or looking around. The decrease in your automatic reflexes complicates motor function, so the less distraction, the better.  * Do not wear rubber or gripping soled shoes, they might "catch" on the floor and cause tripping.  * Move slowly when changing positions. Use deliberate, concentrated movements and, if needed, use a grab bar or walking aid. Count fifteen (15) seconds after standing to begin walking.  * If balance is a continuous problem, you might want to consider a walking aid such as a cane, walking stick, or walker. Once you have mastered walking with help, you may be ready to try it again on your own.  This information is provided by [facility] and is not intended to replace the medical advice of your physician or other health care providers. Please consult your physician or other health care providers for advice regarding your specific medical condition.

## 2018-03-31 DIAGNOSIS — L97512 Non-pressure chronic ulcer of other part of right foot with fat layer exposed: Secondary | ICD-10-CM | POA: Diagnosis not present

## 2018-04-04 ENCOUNTER — Telehealth: Payer: Self-pay | Admitting: *Deleted

## 2018-04-04 LAB — VITAMIN B1: VITAMIN B1 (THIAMINE): 9 nmol/L (ref 8–30)

## 2018-04-04 LAB — PROTEIN ELECTROPHORESIS, SERUM
ALPHA 2: 0.9 g/dL (ref 0.5–0.9)
Albumin ELP: 3.9 g/dL (ref 3.8–4.8)
Alpha 1: 0.3 g/dL (ref 0.2–0.3)
Beta 2: 0.4 g/dL (ref 0.2–0.5)
Beta Globulin: 0.5 g/dL (ref 0.4–0.6)
GAMMA GLOBULIN: 0.8 g/dL (ref 0.8–1.7)
TOTAL PROTEIN: 6.9 g/dL (ref 6.1–8.1)

## 2018-04-04 LAB — IMMUNOFIXATION ELECTROPHORESIS
IgG (Immunoglobin G), Serum: 821 mg/dL (ref 600–1540)
IgM, Serum: 145 mg/dL (ref 50–300)
Immunoglobulin A: 260 mg/dL (ref 20–320)

## 2018-04-04 LAB — COPPER, SERUM: COPPER: 151 ug/dL (ref 70–175)

## 2018-04-04 NOTE — Telephone Encounter (Signed)
Left message notifying patient.

## 2018-04-04 NOTE — Telephone Encounter (Signed)
-----   Message from Alda Berthold, DO sent at 04/04/2018 12:53 PM EST ----- Please notify patient lab are within normal limits.  Thank you.

## 2018-04-11 DIAGNOSIS — H2511 Age-related nuclear cataract, right eye: Secondary | ICD-10-CM | POA: Diagnosis not present

## 2018-04-12 DIAGNOSIS — H2512 Age-related nuclear cataract, left eye: Secondary | ICD-10-CM | POA: Diagnosis not present

## 2018-04-12 DIAGNOSIS — J449 Chronic obstructive pulmonary disease, unspecified: Secondary | ICD-10-CM | POA: Diagnosis not present

## 2018-04-12 DIAGNOSIS — G4733 Obstructive sleep apnea (adult) (pediatric): Secondary | ICD-10-CM | POA: Diagnosis not present

## 2018-04-12 DIAGNOSIS — I10 Essential (primary) hypertension: Secondary | ICD-10-CM | POA: Diagnosis not present

## 2018-04-13 DIAGNOSIS — J449 Chronic obstructive pulmonary disease, unspecified: Secondary | ICD-10-CM | POA: Diagnosis not present

## 2018-04-18 DIAGNOSIS — H2589 Other age-related cataract: Secondary | ICD-10-CM | POA: Diagnosis not present

## 2018-04-25 DIAGNOSIS — H2512 Age-related nuclear cataract, left eye: Secondary | ICD-10-CM | POA: Diagnosis not present

## 2018-04-28 DIAGNOSIS — J449 Chronic obstructive pulmonary disease, unspecified: Secondary | ICD-10-CM | POA: Diagnosis not present

## 2018-04-28 DIAGNOSIS — G4733 Obstructive sleep apnea (adult) (pediatric): Secondary | ICD-10-CM | POA: Diagnosis not present

## 2018-05-02 DIAGNOSIS — D7589 Other specified diseases of blood and blood-forming organs: Secondary | ICD-10-CM | POA: Diagnosis not present

## 2018-05-02 DIAGNOSIS — D538 Other specified nutritional anemias: Secondary | ICD-10-CM | POA: Diagnosis not present

## 2018-05-02 DIAGNOSIS — E7849 Other hyperlipidemia: Secondary | ICD-10-CM | POA: Diagnosis not present

## 2018-05-02 DIAGNOSIS — E538 Deficiency of other specified B group vitamins: Secondary | ICD-10-CM | POA: Diagnosis not present

## 2018-05-02 DIAGNOSIS — E559 Vitamin D deficiency, unspecified: Secondary | ICD-10-CM | POA: Diagnosis not present

## 2018-05-02 DIAGNOSIS — D539 Nutritional anemia, unspecified: Secondary | ICD-10-CM | POA: Diagnosis not present

## 2018-05-02 DIAGNOSIS — E1169 Type 2 diabetes mellitus with other specified complication: Secondary | ICD-10-CM | POA: Diagnosis not present

## 2018-05-02 DIAGNOSIS — M810 Age-related osteoporosis without current pathological fracture: Secondary | ICD-10-CM | POA: Diagnosis not present

## 2018-05-09 ENCOUNTER — Other Ambulatory Visit (HOSPITAL_COMMUNITY): Payer: Self-pay | Admitting: Internal Medicine

## 2018-05-09 ENCOUNTER — Other Ambulatory Visit: Payer: Self-pay | Admitting: Internal Medicine

## 2018-05-09 DIAGNOSIS — E1169 Type 2 diabetes mellitus with other specified complication: Secondary | ICD-10-CM | POA: Diagnosis not present

## 2018-05-09 DIAGNOSIS — M810 Age-related osteoporosis without current pathological fracture: Secondary | ICD-10-CM | POA: Diagnosis not present

## 2018-05-09 DIAGNOSIS — Z0000 Encounter for general adult medical examination without abnormal findings: Secondary | ICD-10-CM | POA: Diagnosis not present

## 2018-05-09 DIAGNOSIS — J45998 Other asthma: Secondary | ICD-10-CM | POA: Diagnosis not present

## 2018-05-09 DIAGNOSIS — D538 Other specified nutritional anemias: Secondary | ICD-10-CM | POA: Diagnosis not present

## 2018-05-09 DIAGNOSIS — R808 Other proteinuria: Secondary | ICD-10-CM | POA: Diagnosis not present

## 2018-05-09 DIAGNOSIS — I10 Essential (primary) hypertension: Secondary | ICD-10-CM | POA: Diagnosis not present

## 2018-05-09 DIAGNOSIS — R82998 Other abnormal findings in urine: Secondary | ICD-10-CM | POA: Diagnosis not present

## 2018-05-09 DIAGNOSIS — G608 Other hereditary and idiopathic neuropathies: Secondary | ICD-10-CM | POA: Diagnosis not present

## 2018-05-09 DIAGNOSIS — I878 Other specified disorders of veins: Secondary | ICD-10-CM | POA: Diagnosis not present

## 2018-05-09 DIAGNOSIS — R011 Cardiac murmur, unspecified: Secondary | ICD-10-CM

## 2018-05-09 DIAGNOSIS — J441 Chronic obstructive pulmonary disease with (acute) exacerbation: Secondary | ICD-10-CM | POA: Diagnosis not present

## 2018-05-10 DIAGNOSIS — Z1212 Encounter for screening for malignant neoplasm of rectum: Secondary | ICD-10-CM | POA: Diagnosis not present

## 2018-05-10 DIAGNOSIS — J449 Chronic obstructive pulmonary disease, unspecified: Secondary | ICD-10-CM | POA: Diagnosis not present

## 2018-05-11 ENCOUNTER — Other Ambulatory Visit: Payer: Self-pay

## 2018-05-11 ENCOUNTER — Ambulatory Visit (HOSPITAL_COMMUNITY): Payer: Medicare HMO | Attending: Cardiology

## 2018-05-11 DIAGNOSIS — R011 Cardiac murmur, unspecified: Secondary | ICD-10-CM | POA: Diagnosis not present

## 2018-05-16 DIAGNOSIS — L03032 Cellulitis of left toe: Secondary | ICD-10-CM | POA: Diagnosis not present

## 2018-05-16 DIAGNOSIS — L97512 Non-pressure chronic ulcer of other part of right foot with fat layer exposed: Secondary | ICD-10-CM | POA: Diagnosis not present

## 2018-05-16 DIAGNOSIS — G629 Polyneuropathy, unspecified: Secondary | ICD-10-CM | POA: Diagnosis not present

## 2018-05-16 DIAGNOSIS — L03031 Cellulitis of right toe: Secondary | ICD-10-CM | POA: Diagnosis not present

## 2018-05-29 DIAGNOSIS — G4733 Obstructive sleep apnea (adult) (pediatric): Secondary | ICD-10-CM | POA: Diagnosis not present

## 2018-05-29 DIAGNOSIS — J449 Chronic obstructive pulmonary disease, unspecified: Secondary | ICD-10-CM | POA: Diagnosis not present

## 2018-06-07 DIAGNOSIS — J449 Chronic obstructive pulmonary disease, unspecified: Secondary | ICD-10-CM | POA: Diagnosis not present

## 2018-06-23 DIAGNOSIS — L97512 Non-pressure chronic ulcer of other part of right foot with fat layer exposed: Secondary | ICD-10-CM | POA: Diagnosis not present

## 2018-06-27 DIAGNOSIS — J449 Chronic obstructive pulmonary disease, unspecified: Secondary | ICD-10-CM | POA: Diagnosis not present

## 2018-06-27 DIAGNOSIS — G4733 Obstructive sleep apnea (adult) (pediatric): Secondary | ICD-10-CM | POA: Diagnosis not present

## 2018-07-08 DIAGNOSIS — J449 Chronic obstructive pulmonary disease, unspecified: Secondary | ICD-10-CM | POA: Diagnosis not present

## 2018-07-25 DIAGNOSIS — G4733 Obstructive sleep apnea (adult) (pediatric): Secondary | ICD-10-CM | POA: Diagnosis not present

## 2018-07-25 DIAGNOSIS — I10 Essential (primary) hypertension: Secondary | ICD-10-CM | POA: Diagnosis not present

## 2018-07-25 DIAGNOSIS — J449 Chronic obstructive pulmonary disease, unspecified: Secondary | ICD-10-CM | POA: Diagnosis not present

## 2018-07-28 DIAGNOSIS — J449 Chronic obstructive pulmonary disease, unspecified: Secondary | ICD-10-CM | POA: Diagnosis not present

## 2018-07-28 DIAGNOSIS — G4733 Obstructive sleep apnea (adult) (pediatric): Secondary | ICD-10-CM | POA: Diagnosis not present

## 2018-07-30 DIAGNOSIS — I10 Essential (primary) hypertension: Secondary | ICD-10-CM | POA: Diagnosis not present

## 2018-07-30 DIAGNOSIS — J449 Chronic obstructive pulmonary disease, unspecified: Secondary | ICD-10-CM | POA: Diagnosis not present

## 2018-07-30 DIAGNOSIS — G4733 Obstructive sleep apnea (adult) (pediatric): Secondary | ICD-10-CM | POA: Diagnosis not present

## 2018-08-04 DIAGNOSIS — J449 Chronic obstructive pulmonary disease, unspecified: Secondary | ICD-10-CM | POA: Diagnosis not present

## 2018-08-27 DIAGNOSIS — G4733 Obstructive sleep apnea (adult) (pediatric): Secondary | ICD-10-CM | POA: Diagnosis not present

## 2018-08-27 DIAGNOSIS — J449 Chronic obstructive pulmonary disease, unspecified: Secondary | ICD-10-CM | POA: Diagnosis not present

## 2018-09-06 DIAGNOSIS — J449 Chronic obstructive pulmonary disease, unspecified: Secondary | ICD-10-CM | POA: Diagnosis not present

## 2018-09-16 ENCOUNTER — Encounter: Payer: Self-pay | Admitting: Neurology

## 2018-09-16 ENCOUNTER — Other Ambulatory Visit: Payer: Self-pay

## 2018-09-16 ENCOUNTER — Telehealth (INDEPENDENT_AMBULATORY_CARE_PROVIDER_SITE_OTHER): Payer: Medicare HMO | Admitting: Neurology

## 2018-09-16 VITALS — BP 140/75 | Ht 64.0 in | Wt 300.0 lb

## 2018-09-16 DIAGNOSIS — G609 Hereditary and idiopathic neuropathy, unspecified: Secondary | ICD-10-CM | POA: Diagnosis not present

## 2018-09-16 DIAGNOSIS — R278 Other lack of coordination: Secondary | ICD-10-CM

## 2018-09-16 NOTE — Progress Notes (Signed)
    Virtual Visit via Telephone Note The purpose of this virtual visit is to provide medical care while limiting exposure to the novel coronavirus.    Consent was obtained for phone visit:  Yes.   Answered questions that patient had about telehealth interaction:  Yes.   I discussed the limitations, risks, security and privacy concerns of performing an evaluation and management service by telephone. I also discussed with the patient that there may be a patient responsible charge related to this service. The patient expressed understanding and agreed to proceed.  Pt location: Home Physician Location: office Name of referring provider:  Crist Infante, MD I connected with .Cassandra Hart at patients initiation/request on 09/16/2018 at  9:00 AM EDT by telephone and verified that I am speaking with the correct person using two identifiers.  Pt MRN:  [id number] Pt DOB:  03/19/1946   History of Present Illness: This is a 73 year-old female with hypertension, asthma, OA, OSA, COPD, and prediabetes for follow-up of neuropathy.  Overall, her neuropathy has been stable since her last visit, with no further progression into the legs or hands.  Painful tingling and burning sensation is well controlled on gabapentin 300 mg 3 times daily.  She is very compliant compliant and using a cane and has not suffered any falls.  She does express some imbalance, especially when her eyes are closed on uneven ground.  She does have a shower chair which she uses.  She continues to struggle with fine motor tasks, such as buttoning, due to inability to feel with the fingertips.   Assessment and Plan:   Idiopathic peripheral neuropathy, involving stocking glove distribution.  Clinically stable. Paresthesias are well controlled on gabapentin 300 mg 3 times daily, which will be continued. She is very compliant with using a cane and daily foot examination. Fall precautions were again discussed.    Follow Up Instructions:  I  discussed the assessment and treatment plan with the patient. The patient was provided an opportunity to ask questions and all were answered. The patient agreed with the plan and demonstrated an understanding of the instructions.   The patient was advised to call back or seek an in-person evaluation if the symptoms worsen or if the condition fails to improve as anticipated.  I will see her back in 1 year, or sooner as needed.   Total Time spent in visit with the patient was:  15 min, of which 100% of the time was spent in counseling and/or coordinating care.   Pt understands and agrees with the plan of care outlined.     Alda Berthold, DO

## 2018-09-23 DIAGNOSIS — I77810 Thoracic aortic ectasia: Secondary | ICD-10-CM | POA: Diagnosis not present

## 2018-09-23 DIAGNOSIS — G4733 Obstructive sleep apnea (adult) (pediatric): Secondary | ICD-10-CM | POA: Diagnosis not present

## 2018-09-23 DIAGNOSIS — J45909 Unspecified asthma, uncomplicated: Secondary | ICD-10-CM | POA: Diagnosis not present

## 2018-09-23 DIAGNOSIS — I878 Other specified disorders of veins: Secondary | ICD-10-CM | POA: Diagnosis not present

## 2018-09-23 DIAGNOSIS — I10 Essential (primary) hypertension: Secondary | ICD-10-CM | POA: Diagnosis not present

## 2018-09-23 DIAGNOSIS — G609 Hereditary and idiopathic neuropathy, unspecified: Secondary | ICD-10-CM | POA: Diagnosis not present

## 2018-09-23 DIAGNOSIS — E1169 Type 2 diabetes mellitus with other specified complication: Secondary | ICD-10-CM | POA: Diagnosis not present

## 2018-09-23 DIAGNOSIS — R946 Abnormal results of thyroid function studies: Secondary | ICD-10-CM | POA: Diagnosis not present

## 2018-09-27 DIAGNOSIS — J449 Chronic obstructive pulmonary disease, unspecified: Secondary | ICD-10-CM | POA: Diagnosis not present

## 2018-09-27 DIAGNOSIS — G4733 Obstructive sleep apnea (adult) (pediatric): Secondary | ICD-10-CM | POA: Diagnosis not present

## 2018-09-30 ENCOUNTER — Ambulatory Visit: Payer: Medicare HMO | Admitting: Neurology

## 2018-10-03 DIAGNOSIS — R946 Abnormal results of thyroid function studies: Secondary | ICD-10-CM | POA: Diagnosis not present

## 2018-10-03 DIAGNOSIS — E1169 Type 2 diabetes mellitus with other specified complication: Secondary | ICD-10-CM | POA: Diagnosis not present

## 2018-10-04 DIAGNOSIS — J449 Chronic obstructive pulmonary disease, unspecified: Secondary | ICD-10-CM | POA: Diagnosis not present

## 2018-10-27 DIAGNOSIS — J449 Chronic obstructive pulmonary disease, unspecified: Secondary | ICD-10-CM | POA: Diagnosis not present

## 2018-10-27 DIAGNOSIS — G4733 Obstructive sleep apnea (adult) (pediatric): Secondary | ICD-10-CM | POA: Diagnosis not present

## 2018-11-01 DIAGNOSIS — J449 Chronic obstructive pulmonary disease, unspecified: Secondary | ICD-10-CM | POA: Diagnosis not present

## 2018-11-03 DIAGNOSIS — J449 Chronic obstructive pulmonary disease, unspecified: Secondary | ICD-10-CM | POA: Diagnosis not present

## 2018-11-03 DIAGNOSIS — I10 Essential (primary) hypertension: Secondary | ICD-10-CM | POA: Diagnosis not present

## 2018-11-03 DIAGNOSIS — G4733 Obstructive sleep apnea (adult) (pediatric): Secondary | ICD-10-CM | POA: Diagnosis not present

## 2018-11-27 DIAGNOSIS — J449 Chronic obstructive pulmonary disease, unspecified: Secondary | ICD-10-CM | POA: Diagnosis not present

## 2018-11-27 DIAGNOSIS — G4733 Obstructive sleep apnea (adult) (pediatric): Secondary | ICD-10-CM | POA: Diagnosis not present

## 2018-12-05 DIAGNOSIS — J449 Chronic obstructive pulmonary disease, unspecified: Secondary | ICD-10-CM | POA: Diagnosis not present

## 2018-12-22 DIAGNOSIS — Z23 Encounter for immunization: Secondary | ICD-10-CM | POA: Diagnosis not present

## 2018-12-28 DIAGNOSIS — J449 Chronic obstructive pulmonary disease, unspecified: Secondary | ICD-10-CM | POA: Diagnosis not present

## 2018-12-28 DIAGNOSIS — G4733 Obstructive sleep apnea (adult) (pediatric): Secondary | ICD-10-CM | POA: Diagnosis not present

## 2019-01-03 DIAGNOSIS — J449 Chronic obstructive pulmonary disease, unspecified: Secondary | ICD-10-CM | POA: Diagnosis not present

## 2019-01-27 DIAGNOSIS — J449 Chronic obstructive pulmonary disease, unspecified: Secondary | ICD-10-CM | POA: Diagnosis not present

## 2019-01-27 DIAGNOSIS — G4733 Obstructive sleep apnea (adult) (pediatric): Secondary | ICD-10-CM | POA: Diagnosis not present

## 2019-01-29 DIAGNOSIS — J449 Chronic obstructive pulmonary disease, unspecified: Secondary | ICD-10-CM | POA: Diagnosis not present

## 2019-01-29 DIAGNOSIS — I10 Essential (primary) hypertension: Secondary | ICD-10-CM | POA: Diagnosis not present

## 2019-01-29 DIAGNOSIS — G4733 Obstructive sleep apnea (adult) (pediatric): Secondary | ICD-10-CM | POA: Diagnosis not present

## 2019-01-31 DIAGNOSIS — J449 Chronic obstructive pulmonary disease, unspecified: Secondary | ICD-10-CM | POA: Diagnosis not present

## 2019-02-03 ENCOUNTER — Encounter: Payer: Self-pay | Admitting: Neurology

## 2019-02-03 DIAGNOSIS — J449 Chronic obstructive pulmonary disease, unspecified: Secondary | ICD-10-CM | POA: Diagnosis not present

## 2019-02-27 DIAGNOSIS — J449 Chronic obstructive pulmonary disease, unspecified: Secondary | ICD-10-CM | POA: Diagnosis not present

## 2019-02-27 DIAGNOSIS — G4733 Obstructive sleep apnea (adult) (pediatric): Secondary | ICD-10-CM | POA: Diagnosis not present

## 2019-03-02 DIAGNOSIS — J449 Chronic obstructive pulmonary disease, unspecified: Secondary | ICD-10-CM | POA: Diagnosis not present

## 2019-03-16 DIAGNOSIS — J449 Chronic obstructive pulmonary disease, unspecified: Secondary | ICD-10-CM | POA: Diagnosis not present

## 2019-03-16 DIAGNOSIS — I10 Essential (primary) hypertension: Secondary | ICD-10-CM | POA: Diagnosis not present

## 2019-03-16 DIAGNOSIS — G4733 Obstructive sleep apnea (adult) (pediatric): Secondary | ICD-10-CM | POA: Diagnosis not present

## 2019-03-22 DIAGNOSIS — Z7689 Persons encountering health services in other specified circumstances: Secondary | ICD-10-CM | POA: Diagnosis not present

## 2019-03-22 DIAGNOSIS — N390 Urinary tract infection, site not specified: Secondary | ICD-10-CM | POA: Diagnosis not present

## 2019-03-22 DIAGNOSIS — R300 Dysuria: Secondary | ICD-10-CM | POA: Diagnosis not present

## 2019-03-29 DIAGNOSIS — J449 Chronic obstructive pulmonary disease, unspecified: Secondary | ICD-10-CM | POA: Diagnosis not present

## 2019-03-29 DIAGNOSIS — G4733 Obstructive sleep apnea (adult) (pediatric): Secondary | ICD-10-CM | POA: Diagnosis not present

## 2019-04-03 DIAGNOSIS — J449 Chronic obstructive pulmonary disease, unspecified: Secondary | ICD-10-CM | POA: Diagnosis not present

## 2019-04-13 DIAGNOSIS — J449 Chronic obstructive pulmonary disease, unspecified: Secondary | ICD-10-CM | POA: Diagnosis not present

## 2019-04-29 DIAGNOSIS — J449 Chronic obstructive pulmonary disease, unspecified: Secondary | ICD-10-CM | POA: Diagnosis not present

## 2019-04-29 DIAGNOSIS — G4733 Obstructive sleep apnea (adult) (pediatric): Secondary | ICD-10-CM | POA: Diagnosis not present

## 2019-05-02 DIAGNOSIS — J449 Chronic obstructive pulmonary disease, unspecified: Secondary | ICD-10-CM | POA: Diagnosis not present

## 2019-05-03 DIAGNOSIS — N390 Urinary tract infection, site not specified: Secondary | ICD-10-CM | POA: Diagnosis not present

## 2019-05-03 DIAGNOSIS — R300 Dysuria: Secondary | ICD-10-CM | POA: Diagnosis not present

## 2019-05-16 DIAGNOSIS — J449 Chronic obstructive pulmonary disease, unspecified: Secondary | ICD-10-CM | POA: Diagnosis not present

## 2019-05-26 ENCOUNTER — Ambulatory Visit: Payer: Medicare HMO

## 2019-05-27 ENCOUNTER — Ambulatory Visit: Payer: Medicare HMO

## 2019-05-30 DIAGNOSIS — G4733 Obstructive sleep apnea (adult) (pediatric): Secondary | ICD-10-CM | POA: Diagnosis not present

## 2019-05-30 DIAGNOSIS — R300 Dysuria: Secondary | ICD-10-CM | POA: Diagnosis not present

## 2019-05-30 DIAGNOSIS — J449 Chronic obstructive pulmonary disease, unspecified: Secondary | ICD-10-CM | POA: Diagnosis not present

## 2019-06-01 ENCOUNTER — Ambulatory Visit: Payer: Medicare HMO | Attending: Internal Medicine

## 2019-06-01 DIAGNOSIS — Z23 Encounter for immunization: Secondary | ICD-10-CM | POA: Insufficient documentation

## 2019-06-01 NOTE — Progress Notes (Signed)
   Covid-19 Vaccination Clinic  Name:  Cassandra Hart    MRN: [id number] DOB: [birth year]  06/01/2019  [name] was observed post Covid-19 immunization for 15 minutes without incidence. She was provided with Vaccine Information Sheet and instruction to access the V-Safe system.   [name] was instructed to call 911 with any severe reactions post vaccine: Marland Kitchen Difficulty breathing  . Swelling of your face and throat  . A fast heartbeat  . A bad rash all over your body  . Dizziness and weakness    Immunizations Administered    Name Date Dose VIS Date Route   Pfizer COVID-19 Vaccine 06/01/2019 12:31 PM 0.3 mL 04/07/2019 Intramuscular   Manufacturer: [place]   Lot: [id number]   [place]: [id number]

## 2019-06-12 ENCOUNTER — Ambulatory Visit: Payer: Medicare HMO

## 2019-06-13 DIAGNOSIS — D539 Nutritional anemia, unspecified: Secondary | ICD-10-CM | POA: Diagnosis not present

## 2019-06-13 DIAGNOSIS — E119 Type 2 diabetes mellitus without complications: Secondary | ICD-10-CM | POA: Diagnosis not present

## 2019-06-13 DIAGNOSIS — R946 Abnormal results of thyroid function studies: Secondary | ICD-10-CM | POA: Diagnosis not present

## 2019-06-13 DIAGNOSIS — Z0000 Encounter for general adult medical examination without abnormal findings: Secondary | ICD-10-CM | POA: Diagnosis not present

## 2019-06-13 DIAGNOSIS — M810 Age-related osteoporosis without current pathological fracture: Secondary | ICD-10-CM | POA: Diagnosis not present

## 2019-06-13 DIAGNOSIS — E7849 Other hyperlipidemia: Secondary | ICD-10-CM | POA: Diagnosis not present

## 2019-06-14 DIAGNOSIS — J449 Chronic obstructive pulmonary disease, unspecified: Secondary | ICD-10-CM | POA: Diagnosis not present

## 2019-06-19 DIAGNOSIS — J449 Chronic obstructive pulmonary disease, unspecified: Secondary | ICD-10-CM | POA: Diagnosis not present

## 2019-06-20 DIAGNOSIS — G609 Hereditary and idiopathic neuropathy, unspecified: Secondary | ICD-10-CM | POA: Diagnosis not present

## 2019-06-20 DIAGNOSIS — Z0000 Encounter for general adult medical examination without abnormal findings: Secondary | ICD-10-CM | POA: Diagnosis not present

## 2019-06-20 DIAGNOSIS — I77810 Thoracic aortic ectasia: Secondary | ICD-10-CM | POA: Diagnosis not present

## 2019-06-20 DIAGNOSIS — I878 Other specified disorders of veins: Secondary | ICD-10-CM | POA: Diagnosis not present

## 2019-06-20 DIAGNOSIS — R809 Proteinuria, unspecified: Secondary | ICD-10-CM | POA: Diagnosis not present

## 2019-06-20 DIAGNOSIS — R011 Cardiac murmur, unspecified: Secondary | ICD-10-CM | POA: Diagnosis not present

## 2019-06-20 DIAGNOSIS — E1169 Type 2 diabetes mellitus with other specified complication: Secondary | ICD-10-CM | POA: Diagnosis not present

## 2019-06-20 DIAGNOSIS — Z1331 Encounter for screening for depression: Secondary | ICD-10-CM | POA: Diagnosis not present

## 2019-06-20 DIAGNOSIS — R946 Abnormal results of thyroid function studies: Secondary | ICD-10-CM | POA: Diagnosis not present

## 2019-06-20 DIAGNOSIS — J45909 Unspecified asthma, uncomplicated: Secondary | ICD-10-CM | POA: Diagnosis not present

## 2019-06-26 ENCOUNTER — Ambulatory Visit: Payer: Medicare HMO | Attending: Internal Medicine

## 2019-06-26 DIAGNOSIS — Z23 Encounter for immunization: Secondary | ICD-10-CM | POA: Insufficient documentation

## 2019-06-26 DIAGNOSIS — R82998 Other abnormal findings in urine: Secondary | ICD-10-CM | POA: Diagnosis not present

## 2019-06-26 NOTE — Progress Notes (Signed)
   Covid-19 Vaccination Clinic  Name:  Cassandra Hart    MRN: [id number] DOB: 02/17/1946  06/26/2019  [name] was observed post Covid-19 immunization for 15 minutes without incidence. She was provided with Vaccine Information Sheet and instruction to access the V-Safe system.   [name] was instructed to call 911 with any severe reactions post vaccine: Marland Kitchen Difficulty breathing  . Swelling of your face and throat  . A fast heartbeat  . A bad rash all over your body  . Dizziness and weakness    Immunizations Administered    Name Date Dose VIS Date Route   Pfizer COVID-19 Vaccine 06/26/2019  3:37 PM 0.3 mL 04/07/2019 Intramuscular   Manufacturer: [place]   Lot: [id number]   [place]: [id number]

## 2019-06-27 DIAGNOSIS — G4733 Obstructive sleep apnea (adult) (pediatric): Secondary | ICD-10-CM | POA: Diagnosis not present

## 2019-06-27 DIAGNOSIS — J449 Chronic obstructive pulmonary disease, unspecified: Secondary | ICD-10-CM | POA: Diagnosis not present

## 2019-07-17 DIAGNOSIS — J449 Chronic obstructive pulmonary disease, unspecified: Secondary | ICD-10-CM | POA: Diagnosis not present

## 2019-07-19 ENCOUNTER — Encounter: Payer: Self-pay | Admitting: Neurology

## 2019-07-19 DIAGNOSIS — J449 Chronic obstructive pulmonary disease, unspecified: Secondary | ICD-10-CM | POA: Diagnosis not present

## 2019-07-20 ENCOUNTER — Other Ambulatory Visit: Payer: Self-pay

## 2019-07-20 ENCOUNTER — Telehealth (INDEPENDENT_AMBULATORY_CARE_PROVIDER_SITE_OTHER): Payer: Medicare HMO | Admitting: Neurology

## 2019-07-20 VITALS — Ht 64.0 in | Wt 300.0 lb

## 2019-07-20 DIAGNOSIS — G609 Hereditary and idiopathic neuropathy, unspecified: Secondary | ICD-10-CM

## 2019-07-20 DIAGNOSIS — R278 Other lack of coordination: Secondary | ICD-10-CM | POA: Diagnosis not present

## 2019-07-20 NOTE — Progress Notes (Signed)
   Due to the COVID-19 crisis, this virtual check-in visit was done via telephone from my office and it was initiated and consent given by this patient and or family.  Telephone (Audio) Visit The purpose of this telephone visit is to provide medical care while limiting exposure to the novel coronavirus.    Consent was obtained for telephone visit and initiated by pt/family:  Yes.   Answered questions that patient had about telehealth interaction:  Yes.   I discussed the limitations, risks, security and privacy concerns of performing an evaluation and management service by telephone. I also discussed with the patient that there may be a patient responsible charge related to this service. The patient expressed understanding and agreed to proceed.  Pt location: Home Physician Location: office Name of referring provider:  Crist Infante, MD I connected with .Cassandra Hart at patients initiation/request on 07/20/2019 at  8:10 AM EDT by telephone and verified that I am speaking with the correct person using two identifiers.  Pt MRN:  [id number] Pt DOB:  [birth year]  Assessment/Plan:  Idiopathic peripheral neuropathy, mild progression in the hands, stable in the feet/legs  - Continue gabapentin 900mg  three times daily, which is prescribed by her PCP  - Start using a shower chair/stool   - She is compliant with using a cane and daily foot inspection   Subjective:  This is a 74 year-old female returning for follow-up of neuropathy.  Symptoms are stable in the feet and lower legs, but tend to feel a little more numbness in the hands.  Pain is well-controlled on gabapentin 900mg  three times daily.  Her pain is worse if she happens to skip her afternoon dose.  She has not suffered any fall and uses a cane when walking.  She is aware of imbalance when on uneven. She has imbalance especially when eyes are closed, such as in the shower, so keeps her eyes open.  No new complaints.  She has received both COVID19  vaccinations and tolerated it well.     Objective:   Vitals:   07/19/19 1605  Weight: 300 lb (136.1 kg)  Height: 5\' 4"  (1.626 m)     Follow Up Instructions:     I discussed the assessment and treatment plan with the patient. The patient was provided an opportunity to ask questions and all were answered. The patient agreed with the plan and demonstrated an understanding of the instructions.   The patient was advised to call back or seek an in-person evaluation if the symptoms worsen or if the condition fails to improve as anticipated.    Total Time spent in visit with the patient was:  9 min, of which 100% of the time was spent in counseling and/or coordinating care.   Pt understands and agrees with the plan of care outlined.     Alda Berthold, DO

## 2019-07-21 DIAGNOSIS — Z1212 Encounter for screening for malignant neoplasm of rectum: Secondary | ICD-10-CM | POA: Diagnosis not present

## 2019-07-28 DIAGNOSIS — J449 Chronic obstructive pulmonary disease, unspecified: Secondary | ICD-10-CM | POA: Diagnosis not present

## 2019-07-28 DIAGNOSIS — G4733 Obstructive sleep apnea (adult) (pediatric): Secondary | ICD-10-CM | POA: Diagnosis not present

## 2019-07-30 DIAGNOSIS — G4733 Obstructive sleep apnea (adult) (pediatric): Secondary | ICD-10-CM | POA: Diagnosis not present

## 2019-07-30 DIAGNOSIS — J449 Chronic obstructive pulmonary disease, unspecified: Secondary | ICD-10-CM | POA: Diagnosis not present

## 2019-07-30 DIAGNOSIS — I10 Essential (primary) hypertension: Secondary | ICD-10-CM | POA: Diagnosis not present

## 2019-08-14 DIAGNOSIS — J449 Chronic obstructive pulmonary disease, unspecified: Secondary | ICD-10-CM | POA: Diagnosis not present

## 2019-08-14 DIAGNOSIS — J45998 Other asthma: Secondary | ICD-10-CM | POA: Diagnosis not present

## 2019-08-23 DIAGNOSIS — J449 Chronic obstructive pulmonary disease, unspecified: Secondary | ICD-10-CM | POA: Diagnosis not present

## 2019-08-23 DIAGNOSIS — G4733 Obstructive sleep apnea (adult) (pediatric): Secondary | ICD-10-CM | POA: Diagnosis not present

## 2019-08-23 DIAGNOSIS — I10 Essential (primary) hypertension: Secondary | ICD-10-CM | POA: Diagnosis not present

## 2019-08-27 DIAGNOSIS — G4733 Obstructive sleep apnea (adult) (pediatric): Secondary | ICD-10-CM | POA: Diagnosis not present

## 2019-08-27 DIAGNOSIS — J449 Chronic obstructive pulmonary disease, unspecified: Secondary | ICD-10-CM | POA: Diagnosis not present

## 2019-09-14 DIAGNOSIS — J449 Chronic obstructive pulmonary disease, unspecified: Secondary | ICD-10-CM | POA: Diagnosis not present

## 2019-09-14 DIAGNOSIS — J45998 Other asthma: Secondary | ICD-10-CM | POA: Diagnosis not present

## 2019-09-20 ENCOUNTER — Ambulatory Visit: Payer: Medicare HMO | Admitting: Neurology

## 2019-09-21 ENCOUNTER — Ambulatory Visit: Payer: Medicare HMO | Admitting: Neurology

## 2019-09-27 DIAGNOSIS — G4733 Obstructive sleep apnea (adult) (pediatric): Secondary | ICD-10-CM | POA: Diagnosis not present

## 2019-09-27 DIAGNOSIS — J449 Chronic obstructive pulmonary disease, unspecified: Secondary | ICD-10-CM | POA: Diagnosis not present

## 2019-10-16 DIAGNOSIS — J449 Chronic obstructive pulmonary disease, unspecified: Secondary | ICD-10-CM | POA: Diagnosis not present

## 2019-10-16 DIAGNOSIS — J45998 Other asthma: Secondary | ICD-10-CM | POA: Diagnosis not present

## 2019-10-27 DIAGNOSIS — J449 Chronic obstructive pulmonary disease, unspecified: Secondary | ICD-10-CM | POA: Diagnosis not present

## 2019-10-27 DIAGNOSIS — G4733 Obstructive sleep apnea (adult) (pediatric): Secondary | ICD-10-CM | POA: Diagnosis not present

## 2019-11-13 DIAGNOSIS — J449 Chronic obstructive pulmonary disease, unspecified: Secondary | ICD-10-CM | POA: Diagnosis not present

## 2019-11-13 DIAGNOSIS — J45998 Other asthma: Secondary | ICD-10-CM | POA: Diagnosis not present

## 2019-11-21 DIAGNOSIS — J449 Chronic obstructive pulmonary disease, unspecified: Secondary | ICD-10-CM | POA: Diagnosis not present

## 2019-11-21 DIAGNOSIS — G4733 Obstructive sleep apnea (adult) (pediatric): Secondary | ICD-10-CM | POA: Diagnosis not present

## 2019-11-21 DIAGNOSIS — I10 Essential (primary) hypertension: Secondary | ICD-10-CM | POA: Diagnosis not present

## 2019-11-27 DIAGNOSIS — G4733 Obstructive sleep apnea (adult) (pediatric): Secondary | ICD-10-CM | POA: Diagnosis not present

## 2019-11-27 DIAGNOSIS — J449 Chronic obstructive pulmonary disease, unspecified: Secondary | ICD-10-CM | POA: Diagnosis not present

## 2019-12-12 DIAGNOSIS — J45998 Other asthma: Secondary | ICD-10-CM | POA: Diagnosis not present

## 2019-12-12 DIAGNOSIS — J449 Chronic obstructive pulmonary disease, unspecified: Secondary | ICD-10-CM | POA: Diagnosis not present

## 2019-12-28 DIAGNOSIS — J449 Chronic obstructive pulmonary disease, unspecified: Secondary | ICD-10-CM | POA: Diagnosis not present

## 2019-12-28 DIAGNOSIS — G4733 Obstructive sleep apnea (adult) (pediatric): Secondary | ICD-10-CM | POA: Diagnosis not present

## 2020-01-02 DIAGNOSIS — G609 Hereditary and idiopathic neuropathy, unspecified: Secondary | ICD-10-CM | POA: Diagnosis not present

## 2020-01-02 DIAGNOSIS — E1169 Type 2 diabetes mellitus with other specified complication: Secondary | ICD-10-CM | POA: Diagnosis not present

## 2020-01-02 DIAGNOSIS — E785 Hyperlipidemia, unspecified: Secondary | ICD-10-CM | POA: Diagnosis not present

## 2020-01-02 DIAGNOSIS — J45909 Unspecified asthma, uncomplicated: Secondary | ICD-10-CM | POA: Diagnosis not present

## 2020-01-02 DIAGNOSIS — M810 Age-related osteoporosis without current pathological fracture: Secondary | ICD-10-CM | POA: Diagnosis not present

## 2020-01-02 DIAGNOSIS — G4733 Obstructive sleep apnea (adult) (pediatric): Secondary | ICD-10-CM | POA: Diagnosis not present

## 2020-01-02 DIAGNOSIS — D539 Nutritional anemia, unspecified: Secondary | ICD-10-CM | POA: Diagnosis not present

## 2020-01-02 DIAGNOSIS — I10 Essential (primary) hypertension: Secondary | ICD-10-CM | POA: Diagnosis not present

## 2020-01-02 DIAGNOSIS — J441 Chronic obstructive pulmonary disease with (acute) exacerbation: Secondary | ICD-10-CM | POA: Diagnosis not present

## 2020-01-10 DIAGNOSIS — J449 Chronic obstructive pulmonary disease, unspecified: Secondary | ICD-10-CM | POA: Diagnosis not present

## 2020-01-10 DIAGNOSIS — J45998 Other asthma: Secondary | ICD-10-CM | POA: Diagnosis not present

## 2020-01-12 DIAGNOSIS — E1169 Type 2 diabetes mellitus with other specified complication: Secondary | ICD-10-CM | POA: Diagnosis not present

## 2020-01-27 DIAGNOSIS — J449 Chronic obstructive pulmonary disease, unspecified: Secondary | ICD-10-CM | POA: Diagnosis not present

## 2020-01-27 DIAGNOSIS — G4733 Obstructive sleep apnea (adult) (pediatric): Secondary | ICD-10-CM | POA: Diagnosis not present

## 2020-02-01 DIAGNOSIS — H40013 Open angle with borderline findings, low risk, bilateral: Secondary | ICD-10-CM | POA: Diagnosis not present

## 2020-02-07 DIAGNOSIS — Z23 Encounter for immunization: Secondary | ICD-10-CM | POA: Diagnosis not present

## 2020-02-09 DIAGNOSIS — J45998 Other asthma: Secondary | ICD-10-CM | POA: Diagnosis not present

## 2020-02-09 DIAGNOSIS — J449 Chronic obstructive pulmonary disease, unspecified: Secondary | ICD-10-CM | POA: Diagnosis not present

## 2020-02-27 DIAGNOSIS — G4733 Obstructive sleep apnea (adult) (pediatric): Secondary | ICD-10-CM | POA: Diagnosis not present

## 2020-02-27 DIAGNOSIS — J449 Chronic obstructive pulmonary disease, unspecified: Secondary | ICD-10-CM | POA: Diagnosis not present

## 2020-03-06 DIAGNOSIS — J449 Chronic obstructive pulmonary disease, unspecified: Secondary | ICD-10-CM | POA: Diagnosis not present

## 2020-03-06 DIAGNOSIS — I10 Essential (primary) hypertension: Secondary | ICD-10-CM | POA: Diagnosis not present

## 2020-03-06 DIAGNOSIS — G4733 Obstructive sleep apnea (adult) (pediatric): Secondary | ICD-10-CM | POA: Diagnosis not present

## 2020-03-11 DIAGNOSIS — J45998 Other asthma: Secondary | ICD-10-CM | POA: Diagnosis not present

## 2020-03-11 DIAGNOSIS — J449 Chronic obstructive pulmonary disease, unspecified: Secondary | ICD-10-CM | POA: Diagnosis not present

## 2020-03-28 DIAGNOSIS — G4733 Obstructive sleep apnea (adult) (pediatric): Secondary | ICD-10-CM | POA: Diagnosis not present

## 2020-03-28 DIAGNOSIS — J449 Chronic obstructive pulmonary disease, unspecified: Secondary | ICD-10-CM | POA: Diagnosis not present

## 2020-04-09 DIAGNOSIS — J449 Chronic obstructive pulmonary disease, unspecified: Secondary | ICD-10-CM | POA: Diagnosis not present

## 2020-04-09 DIAGNOSIS — J45998 Other asthma: Secondary | ICD-10-CM | POA: Diagnosis not present

## 2020-04-28 DIAGNOSIS — J449 Chronic obstructive pulmonary disease, unspecified: Secondary | ICD-10-CM | POA: Diagnosis not present

## 2020-04-28 DIAGNOSIS — G4733 Obstructive sleep apnea (adult) (pediatric): Secondary | ICD-10-CM | POA: Diagnosis not present

## 2020-05-08 DIAGNOSIS — J449 Chronic obstructive pulmonary disease, unspecified: Secondary | ICD-10-CM | POA: Diagnosis not present

## 2020-05-08 DIAGNOSIS — J45998 Other asthma: Secondary | ICD-10-CM | POA: Diagnosis not present

## 2020-05-29 DIAGNOSIS — G4733 Obstructive sleep apnea (adult) (pediatric): Secondary | ICD-10-CM | POA: Diagnosis not present

## 2020-05-29 DIAGNOSIS — J449 Chronic obstructive pulmonary disease, unspecified: Secondary | ICD-10-CM | POA: Diagnosis not present

## 2020-06-10 DIAGNOSIS — G4733 Obstructive sleep apnea (adult) (pediatric): Secondary | ICD-10-CM | POA: Diagnosis not present

## 2020-06-10 DIAGNOSIS — J449 Chronic obstructive pulmonary disease, unspecified: Secondary | ICD-10-CM | POA: Diagnosis not present

## 2020-06-24 DIAGNOSIS — E119 Type 2 diabetes mellitus without complications: Secondary | ICD-10-CM | POA: Diagnosis not present

## 2020-06-24 DIAGNOSIS — J441 Chronic obstructive pulmonary disease with (acute) exacerbation: Secondary | ICD-10-CM | POA: Diagnosis not present

## 2020-06-24 DIAGNOSIS — I10 Essential (primary) hypertension: Secondary | ICD-10-CM | POA: Diagnosis not present

## 2020-06-24 DIAGNOSIS — G609 Hereditary and idiopathic neuropathy, unspecified: Secondary | ICD-10-CM | POA: Diagnosis not present

## 2020-06-24 DIAGNOSIS — E785 Hyperlipidemia, unspecified: Secondary | ICD-10-CM | POA: Diagnosis not present

## 2020-06-24 DIAGNOSIS — M810 Age-related osteoporosis without current pathological fracture: Secondary | ICD-10-CM | POA: Diagnosis not present

## 2020-06-26 DIAGNOSIS — G4733 Obstructive sleep apnea (adult) (pediatric): Secondary | ICD-10-CM | POA: Diagnosis not present

## 2020-06-26 DIAGNOSIS — I878 Other specified disorders of veins: Secondary | ICD-10-CM | POA: Diagnosis not present

## 2020-06-26 DIAGNOSIS — J45909 Unspecified asthma, uncomplicated: Secondary | ICD-10-CM | POA: Diagnosis not present

## 2020-06-26 DIAGNOSIS — R82998 Other abnormal findings in urine: Secondary | ICD-10-CM | POA: Diagnosis not present

## 2020-06-26 DIAGNOSIS — J441 Chronic obstructive pulmonary disease with (acute) exacerbation: Secondary | ICD-10-CM | POA: Diagnosis not present

## 2020-06-26 DIAGNOSIS — E785 Hyperlipidemia, unspecified: Secondary | ICD-10-CM | POA: Diagnosis not present

## 2020-06-26 DIAGNOSIS — E1169 Type 2 diabetes mellitus with other specified complication: Secondary | ICD-10-CM | POA: Diagnosis not present

## 2020-06-26 DIAGNOSIS — Z1212 Encounter for screening for malignant neoplasm of rectum: Secondary | ICD-10-CM | POA: Diagnosis not present

## 2020-06-26 DIAGNOSIS — G609 Hereditary and idiopathic neuropathy, unspecified: Secondary | ICD-10-CM | POA: Diagnosis not present

## 2020-06-26 DIAGNOSIS — I10 Essential (primary) hypertension: Secondary | ICD-10-CM | POA: Diagnosis not present

## 2020-06-26 DIAGNOSIS — I77810 Thoracic aortic ectasia: Secondary | ICD-10-CM | POA: Diagnosis not present

## 2020-06-26 DIAGNOSIS — Z0000 Encounter for general adult medical examination without abnormal findings: Secondary | ICD-10-CM | POA: Diagnosis not present

## 2020-06-26 DIAGNOSIS — J449 Chronic obstructive pulmonary disease, unspecified: Secondary | ICD-10-CM | POA: Diagnosis not present

## 2020-07-08 DIAGNOSIS — J449 Chronic obstructive pulmonary disease, unspecified: Secondary | ICD-10-CM | POA: Diagnosis not present

## 2020-07-08 DIAGNOSIS — G4733 Obstructive sleep apnea (adult) (pediatric): Secondary | ICD-10-CM | POA: Diagnosis not present

## 2020-07-20 DIAGNOSIS — J449 Chronic obstructive pulmonary disease, unspecified: Secondary | ICD-10-CM | POA: Diagnosis not present

## 2020-07-20 DIAGNOSIS — G4733 Obstructive sleep apnea (adult) (pediatric): Secondary | ICD-10-CM | POA: Diagnosis not present

## 2020-07-20 DIAGNOSIS — I10 Essential (primary) hypertension: Secondary | ICD-10-CM | POA: Diagnosis not present

## 2020-07-25 DIAGNOSIS — M810 Age-related osteoporosis without current pathological fracture: Secondary | ICD-10-CM | POA: Diagnosis not present

## 2020-07-25 DIAGNOSIS — M1990 Unspecified osteoarthritis, unspecified site: Secondary | ICD-10-CM | POA: Diagnosis not present

## 2020-07-25 DIAGNOSIS — I10 Essential (primary) hypertension: Secondary | ICD-10-CM | POA: Diagnosis not present

## 2020-07-25 DIAGNOSIS — E1169 Type 2 diabetes mellitus with other specified complication: Secondary | ICD-10-CM | POA: Diagnosis not present

## 2020-07-26 ENCOUNTER — Ambulatory Visit: Payer: Medicare HMO | Admitting: Neurology

## 2020-07-27 DIAGNOSIS — G4733 Obstructive sleep apnea (adult) (pediatric): Secondary | ICD-10-CM | POA: Diagnosis not present

## 2020-07-27 DIAGNOSIS — J449 Chronic obstructive pulmonary disease, unspecified: Secondary | ICD-10-CM | POA: Diagnosis not present

## 2020-08-01 DIAGNOSIS — H40013 Open angle with borderline findings, low risk, bilateral: Secondary | ICD-10-CM | POA: Diagnosis not present

## 2020-08-05 DIAGNOSIS — N390 Urinary tract infection, site not specified: Secondary | ICD-10-CM | POA: Diagnosis not present

## 2020-08-24 DIAGNOSIS — I10 Essential (primary) hypertension: Secondary | ICD-10-CM | POA: Diagnosis not present

## 2020-08-24 DIAGNOSIS — M1990 Unspecified osteoarthritis, unspecified site: Secondary | ICD-10-CM | POA: Diagnosis not present

## 2020-08-24 DIAGNOSIS — M810 Age-related osteoporosis without current pathological fracture: Secondary | ICD-10-CM | POA: Diagnosis not present

## 2020-08-24 DIAGNOSIS — E1169 Type 2 diabetes mellitus with other specified complication: Secondary | ICD-10-CM | POA: Diagnosis not present

## 2020-08-26 DIAGNOSIS — G4733 Obstructive sleep apnea (adult) (pediatric): Secondary | ICD-10-CM | POA: Diagnosis not present

## 2020-08-26 DIAGNOSIS — J449 Chronic obstructive pulmonary disease, unspecified: Secondary | ICD-10-CM | POA: Diagnosis not present

## 2020-09-24 DIAGNOSIS — E1169 Type 2 diabetes mellitus with other specified complication: Secondary | ICD-10-CM | POA: Diagnosis not present

## 2020-09-24 DIAGNOSIS — I10 Essential (primary) hypertension: Secondary | ICD-10-CM | POA: Diagnosis not present

## 2020-09-24 DIAGNOSIS — M1990 Unspecified osteoarthritis, unspecified site: Secondary | ICD-10-CM | POA: Diagnosis not present

## 2020-09-24 DIAGNOSIS — M810 Age-related osteoporosis without current pathological fracture: Secondary | ICD-10-CM | POA: Diagnosis not present

## 2020-09-26 DIAGNOSIS — J449 Chronic obstructive pulmonary disease, unspecified: Secondary | ICD-10-CM | POA: Diagnosis not present

## 2020-09-26 DIAGNOSIS — G4733 Obstructive sleep apnea (adult) (pediatric): Secondary | ICD-10-CM | POA: Diagnosis not present

## 2020-10-04 ENCOUNTER — Ambulatory Visit: Payer: Medicare HMO | Admitting: Neurology

## 2020-10-04 ENCOUNTER — Encounter: Payer: Self-pay | Admitting: Neurology

## 2020-10-04 ENCOUNTER — Other Ambulatory Visit: Payer: Self-pay

## 2020-10-04 VITALS — BP 118/66 | HR 75 | Ht 65.0 in | Wt 281.0 lb

## 2020-10-04 DIAGNOSIS — R278 Other lack of coordination: Secondary | ICD-10-CM

## 2020-10-04 DIAGNOSIS — G609 Hereditary and idiopathic neuropathy, unspecified: Secondary | ICD-10-CM | POA: Diagnosis not present

## 2020-10-04 NOTE — Patient Instructions (Addendum)
You can try using lidocaine ointment to the sole of your feet as needed for pain.

## 2020-10-04 NOTE — Progress Notes (Signed)
Follow-up Visit   Date: 10/04/20   Cassandra Hart MRN: [id number] DOB: 05/07/1945   Interim History: Cassandra Hart is a 75 y.o. right-handed Caucasian female with hypertension, asthma, OSA, OA, COPD, and prediabetes returning to the clinic for follow-up of neuropathy.  The patient was accompanied to the clinic by daughter who also provides collateral information.    History of present illness: Starting around 2010, she began having numbness in the toes, which has gradually moved up her feet into her legs and up to the knees. Sometimes, she has tingling and pulsating sensation of the feet.  She takes gabapentin 300mg  TID which provides relief and has some breakthrough pain every other day.  She had not suffered any falls.  She has been using a cane since the summer of 2019 to help with balance. She does not have leg or feet weakness.   Around 2016, she began having numbness in the fingertips and palms of the hands.  She had difficulty with fine motor tasks such as putting on jewelry and buttoning clothes.   No family history of neuropathy.  She does not drink alcohol. She had borderline diabetes, controlled with diet.   She has seen Dr. Brett Fairy in 2015 for OSA and neuropathy.  Neuropathy labs were normal. She takes vitamin B12 1017mcg daily.  Recent vitamin B12 and TSH was normal.   UPDATE 10/04/2020:  She is here for follow-up.  Her neuropathy remains unchanged and involves from her knees down and fingers.  She has predominately numbness in the hands.  Her legs have tingling, burning sensation which is mostly controlled on gabapentin 900mg  TID.  She endorses occasional shooting pain over the arch of her left foot.  Balance is fair, she is very careful and compliant with using her walking stick and walker for long distances. Fortunately, she has not suffered any falls. No new complaints.    Medications:  Current Outpatient Medications on File Prior to Visit  Medication Sig Dispense Refill    albuterol (PROVENTIL HFA;VENTOLIN HFA) 108 (90 Base) MCG/ACT inhaler Inhale 1 puff into the lungs every 6 (six) hours as needed for wheezing or shortness of breath.     amLODipine (NORVASC) 5 MG tablet Take 5 mg by mouth daily.     aspirin 81 MG tablet Take 81 mg by mouth daily.       Budeson-Glycopyrrol-Formoterol (BREZTRI AEROSPHERE) 160-9-4.8 MCG/ACT AERO 2 puffs     cholecalciferol (VITAMIN D) 1000 UNITS tablet Take 4,000 Units by mouth daily.      cyanocobalamin 1000 MCG tablet Take 1,000 mcg by mouth daily.      gabapentin (NEURONTIN) 100 MG capsule Take 900 mg by mouth 3 (three) times daily.     rosuvastatin (CRESTOR) 20 MG tablet Take 20 mg by mouth daily.     telmisartan-hydrochlorothiazide (MICARDIS HCT) 80-25 MG tablet Take 1 tablet by mouth daily.  3   No current facility-administered medications on file prior to visit.    Allergies:  Allergies  Allergen Reactions   Atorvastatin Other (See Comments)    Vital Signs:  BP 118/66   Pulse 75   Ht 5\' 5"  (1.651 m)   Wt 281 lb (127.5 kg)   SpO2 93%   BMI 46.76 kg/m   Neurological Exam: MENTAL STATUS including orientation to time, place, person, recent and remote memory, attention span and concentration, language, and fund of knowledge is normal.  Speech is not dysarthric.  CRANIAL NERVES:  No visual field defects.  Pupils equal round and reactive to light.  Normal conjugate, extra-ocular eye movements in all directions of gaze.  No ptosis.  Face is symmetric. Palate elevates symmetrically.  Tongue is midline.  MOTOR:  Motor strength is 5/5 in all extremities, except mild weakness 4/5 in the toes bilaterally.  No atrophy, fasciculations or abnormal movements.  No pronator drift.  Tone is normal.  Overgrown toe nails with nail fungus.  MSRs:                                           Right        Left brachioradialis 2+  2+  biceps 2+  2+  triceps 2+  2+  patellar 1+  1+  ankle jerk 0  0  Hoffman no  no  plantar  response down  down   SENSORY:  Vibration absent distal to ankles bilaterally, temperature and pin prick reduced below the knees and over the fingertips.  COORDINATION/GAIT:  Normal finger-to- nose-finger.  Intact rapid alternating movements bilaterally.  Gait is assisted with a cane, slow, and stable.  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  - Always use a cane and walker for long distances  - PT for balance declined  - Recommend that she see podiatry for nail care.  Pt prefers daughter to cut her nails.   Return to clinic as needed   Thank you for allowing me to participate in patient's care.  If I can answer any additional questions, I would be pleased to do so.    Sincerely,    Nissa Stannard K. Posey Pronto, DO

## 2020-10-26 DIAGNOSIS — G4733 Obstructive sleep apnea (adult) (pediatric): Secondary | ICD-10-CM | POA: Diagnosis not present

## 2020-10-26 DIAGNOSIS — J449 Chronic obstructive pulmonary disease, unspecified: Secondary | ICD-10-CM | POA: Diagnosis not present

## 2020-11-24 DIAGNOSIS — E1169 Type 2 diabetes mellitus with other specified complication: Secondary | ICD-10-CM | POA: Diagnosis not present

## 2020-11-24 DIAGNOSIS — M1990 Unspecified osteoarthritis, unspecified site: Secondary | ICD-10-CM | POA: Diagnosis not present

## 2020-11-24 DIAGNOSIS — M810 Age-related osteoporosis without current pathological fracture: Secondary | ICD-10-CM | POA: Diagnosis not present

## 2020-11-24 DIAGNOSIS — I10 Essential (primary) hypertension: Secondary | ICD-10-CM | POA: Diagnosis not present

## 2020-11-26 DIAGNOSIS — J449 Chronic obstructive pulmonary disease, unspecified: Secondary | ICD-10-CM | POA: Diagnosis not present

## 2020-11-26 DIAGNOSIS — G4733 Obstructive sleep apnea (adult) (pediatric): Secondary | ICD-10-CM | POA: Diagnosis not present

## 2020-12-11 ENCOUNTER — Other Ambulatory Visit: Payer: Self-pay | Admitting: Internal Medicine

## 2020-12-11 DIAGNOSIS — Z1231 Encounter for screening mammogram for malignant neoplasm of breast: Secondary | ICD-10-CM

## 2020-12-18 DIAGNOSIS — I10 Essential (primary) hypertension: Secondary | ICD-10-CM | POA: Diagnosis not present

## 2020-12-18 DIAGNOSIS — J449 Chronic obstructive pulmonary disease, unspecified: Secondary | ICD-10-CM | POA: Diagnosis not present

## 2020-12-18 DIAGNOSIS — G4733 Obstructive sleep apnea (adult) (pediatric): Secondary | ICD-10-CM | POA: Diagnosis not present

## 2020-12-27 DIAGNOSIS — G4733 Obstructive sleep apnea (adult) (pediatric): Secondary | ICD-10-CM | POA: Diagnosis not present

## 2020-12-27 DIAGNOSIS — J449 Chronic obstructive pulmonary disease, unspecified: Secondary | ICD-10-CM | POA: Diagnosis not present

## 2021-01-03 ENCOUNTER — Other Ambulatory Visit: Payer: Self-pay

## 2021-01-03 ENCOUNTER — Ambulatory Visit
Admission: RE | Admit: 2021-01-03 | Discharge: 2021-01-03 | Disposition: A | Discharge status: Home / Self Care | Payer: Medicare HMO | Source: Ambulatory Visit | Attending: Internal Medicine | Admitting: Internal Medicine

## 2021-01-03 DIAGNOSIS — Z1231 Encounter for screening mammogram for malignant neoplasm of breast: Secondary | ICD-10-CM

## 2021-01-26 DIAGNOSIS — J449 Chronic obstructive pulmonary disease, unspecified: Secondary | ICD-10-CM | POA: Diagnosis not present

## 2021-01-26 DIAGNOSIS — G4733 Obstructive sleep apnea (adult) (pediatric): Secondary | ICD-10-CM | POA: Diagnosis not present

## 2021-02-01 DIAGNOSIS — Z23 Encounter for immunization: Secondary | ICD-10-CM | POA: Diagnosis not present

## 2021-02-07 DIAGNOSIS — H40013 Open angle with borderline findings, low risk, bilateral: Secondary | ICD-10-CM | POA: Diagnosis not present

## 2021-02-24 DIAGNOSIS — M1990 Unspecified osteoarthritis, unspecified site: Secondary | ICD-10-CM | POA: Diagnosis not present

## 2021-02-24 DIAGNOSIS — E1169 Type 2 diabetes mellitus with other specified complication: Secondary | ICD-10-CM | POA: Diagnosis not present

## 2021-02-24 DIAGNOSIS — I10 Essential (primary) hypertension: Secondary | ICD-10-CM | POA: Diagnosis not present

## 2021-02-24 DIAGNOSIS — E785 Hyperlipidemia, unspecified: Secondary | ICD-10-CM | POA: Diagnosis not present

## 2021-02-26 DIAGNOSIS — J449 Chronic obstructive pulmonary disease, unspecified: Secondary | ICD-10-CM | POA: Diagnosis not present

## 2021-02-26 DIAGNOSIS — G4733 Obstructive sleep apnea (adult) (pediatric): Secondary | ICD-10-CM | POA: Diagnosis not present

## 2021-03-28 DIAGNOSIS — G4733 Obstructive sleep apnea (adult) (pediatric): Secondary | ICD-10-CM | POA: Diagnosis not present

## 2021-03-28 DIAGNOSIS — J449 Chronic obstructive pulmonary disease, unspecified: Secondary | ICD-10-CM | POA: Diagnosis not present

## 2021-04-04 DIAGNOSIS — E785 Hyperlipidemia, unspecified: Secondary | ICD-10-CM | POA: Diagnosis not present

## 2021-04-04 DIAGNOSIS — Z23 Encounter for immunization: Secondary | ICD-10-CM | POA: Diagnosis not present

## 2021-04-04 DIAGNOSIS — I10 Essential (primary) hypertension: Secondary | ICD-10-CM | POA: Diagnosis not present

## 2021-04-04 DIAGNOSIS — J45909 Unspecified asthma, uncomplicated: Secondary | ICD-10-CM | POA: Diagnosis not present

## 2021-04-04 DIAGNOSIS — G4733 Obstructive sleep apnea (adult) (pediatric): Secondary | ICD-10-CM | POA: Diagnosis not present

## 2021-04-04 DIAGNOSIS — M810 Age-related osteoporosis without current pathological fracture: Secondary | ICD-10-CM | POA: Diagnosis not present

## 2021-04-04 DIAGNOSIS — I878 Other specified disorders of veins: Secondary | ICD-10-CM | POA: Diagnosis not present

## 2021-04-04 DIAGNOSIS — E1169 Type 2 diabetes mellitus with other specified complication: Secondary | ICD-10-CM | POA: Diagnosis not present

## 2021-04-04 DIAGNOSIS — J441 Chronic obstructive pulmonary disease with (acute) exacerbation: Secondary | ICD-10-CM | POA: Diagnosis not present

## 2021-04-11 DIAGNOSIS — I10 Essential (primary) hypertension: Secondary | ICD-10-CM | POA: Diagnosis not present

## 2021-04-11 DIAGNOSIS — G4733 Obstructive sleep apnea (adult) (pediatric): Secondary | ICD-10-CM | POA: Diagnosis not present

## 2021-04-11 DIAGNOSIS — J449 Chronic obstructive pulmonary disease, unspecified: Secondary | ICD-10-CM | POA: Diagnosis not present

## 2021-04-28 DIAGNOSIS — G4733 Obstructive sleep apnea (adult) (pediatric): Secondary | ICD-10-CM | POA: Diagnosis not present

## 2021-04-28 DIAGNOSIS — J449 Chronic obstructive pulmonary disease, unspecified: Secondary | ICD-10-CM | POA: Diagnosis not present

## 2021-05-25 DIAGNOSIS — I10 Essential (primary) hypertension: Secondary | ICD-10-CM | POA: Diagnosis not present

## 2021-05-25 DIAGNOSIS — M1990 Unspecified osteoarthritis, unspecified site: Secondary | ICD-10-CM | POA: Diagnosis not present

## 2021-05-25 DIAGNOSIS — E1169 Type 2 diabetes mellitus with other specified complication: Secondary | ICD-10-CM | POA: Diagnosis not present

## 2021-05-25 DIAGNOSIS — M810 Age-related osteoporosis without current pathological fracture: Secondary | ICD-10-CM | POA: Diagnosis not present

## 2021-05-29 DIAGNOSIS — G4733 Obstructive sleep apnea (adult) (pediatric): Secondary | ICD-10-CM | POA: Diagnosis not present

## 2021-05-29 DIAGNOSIS — J449 Chronic obstructive pulmonary disease, unspecified: Secondary | ICD-10-CM | POA: Diagnosis not present

## 2021-06-24 DIAGNOSIS — E1169 Type 2 diabetes mellitus with other specified complication: Secondary | ICD-10-CM | POA: Diagnosis not present

## 2021-06-24 DIAGNOSIS — I10 Essential (primary) hypertension: Secondary | ICD-10-CM | POA: Diagnosis not present

## 2021-06-24 DIAGNOSIS — M1990 Unspecified osteoarthritis, unspecified site: Secondary | ICD-10-CM | POA: Diagnosis not present

## 2021-06-24 DIAGNOSIS — M810 Age-related osteoporosis without current pathological fracture: Secondary | ICD-10-CM | POA: Diagnosis not present

## 2021-06-26 DIAGNOSIS — J449 Chronic obstructive pulmonary disease, unspecified: Secondary | ICD-10-CM | POA: Diagnosis not present

## 2021-06-26 DIAGNOSIS — G4733 Obstructive sleep apnea (adult) (pediatric): Secondary | ICD-10-CM | POA: Diagnosis not present

## 2021-07-17 DIAGNOSIS — I10 Essential (primary) hypertension: Secondary | ICD-10-CM | POA: Diagnosis not present

## 2021-07-17 DIAGNOSIS — J449 Chronic obstructive pulmonary disease, unspecified: Secondary | ICD-10-CM | POA: Diagnosis not present

## 2021-07-17 DIAGNOSIS — G4733 Obstructive sleep apnea (adult) (pediatric): Secondary | ICD-10-CM | POA: Diagnosis not present

## 2021-07-18 DIAGNOSIS — E785 Hyperlipidemia, unspecified: Secondary | ICD-10-CM | POA: Diagnosis not present

## 2021-07-18 DIAGNOSIS — M810 Age-related osteoporosis without current pathological fracture: Secondary | ICD-10-CM | POA: Diagnosis not present

## 2021-07-18 DIAGNOSIS — E1169 Type 2 diabetes mellitus with other specified complication: Secondary | ICD-10-CM | POA: Diagnosis not present

## 2021-07-18 DIAGNOSIS — I10 Essential (primary) hypertension: Secondary | ICD-10-CM | POA: Diagnosis not present

## 2021-07-25 DIAGNOSIS — E1169 Type 2 diabetes mellitus with other specified complication: Secondary | ICD-10-CM | POA: Diagnosis not present

## 2021-07-25 DIAGNOSIS — I10 Essential (primary) hypertension: Secondary | ICD-10-CM | POA: Diagnosis not present

## 2021-07-25 DIAGNOSIS — D539 Nutritional anemia, unspecified: Secondary | ICD-10-CM | POA: Diagnosis not present

## 2021-07-25 DIAGNOSIS — R82998 Other abnormal findings in urine: Secondary | ICD-10-CM | POA: Diagnosis not present

## 2021-07-25 DIAGNOSIS — J441 Chronic obstructive pulmonary disease with (acute) exacerbation: Secondary | ICD-10-CM | POA: Diagnosis not present

## 2021-07-25 DIAGNOSIS — I77810 Thoracic aortic ectasia: Secondary | ICD-10-CM | POA: Diagnosis not present

## 2021-07-25 DIAGNOSIS — G609 Hereditary and idiopathic neuropathy, unspecified: Secondary | ICD-10-CM | POA: Diagnosis not present

## 2021-07-25 DIAGNOSIS — I878 Other specified disorders of veins: Secondary | ICD-10-CM | POA: Diagnosis not present

## 2021-07-25 DIAGNOSIS — Z7689 Persons encountering health services in other specified circumstances: Secondary | ICD-10-CM | POA: Diagnosis not present

## 2021-07-25 DIAGNOSIS — J9611 Chronic respiratory failure with hypoxia: Secondary | ICD-10-CM | POA: Diagnosis not present

## 2021-07-25 DIAGNOSIS — E785 Hyperlipidemia, unspecified: Secondary | ICD-10-CM | POA: Diagnosis not present

## 2021-07-25 DIAGNOSIS — J45909 Unspecified asthma, uncomplicated: Secondary | ICD-10-CM | POA: Diagnosis not present

## 2021-07-25 DIAGNOSIS — Z0000 Encounter for general adult medical examination without abnormal findings: Secondary | ICD-10-CM | POA: Diagnosis not present

## 2021-07-27 DIAGNOSIS — J449 Chronic obstructive pulmonary disease, unspecified: Secondary | ICD-10-CM | POA: Diagnosis not present

## 2021-07-27 DIAGNOSIS — G4733 Obstructive sleep apnea (adult) (pediatric): Secondary | ICD-10-CM | POA: Diagnosis not present

## 2021-07-28 ENCOUNTER — Ambulatory Visit
Admission: RE | Admit: 2021-07-28 | Discharge: 2021-07-28 | Disposition: A | Discharge status: Home / Self Care | Payer: Medicare HMO | Source: Ambulatory Visit | Attending: Internal Medicine | Admitting: Internal Medicine

## 2021-07-28 ENCOUNTER — Other Ambulatory Visit: Payer: Self-pay | Admitting: Internal Medicine

## 2021-07-28 DIAGNOSIS — J9611 Chronic respiratory failure with hypoxia: Secondary | ICD-10-CM

## 2021-07-28 DIAGNOSIS — I700 Atherosclerosis of aorta: Secondary | ICD-10-CM | POA: Diagnosis not present

## 2021-07-28 DIAGNOSIS — J439 Emphysema, unspecified: Secondary | ICD-10-CM | POA: Diagnosis not present

## 2021-07-28 DIAGNOSIS — I2510 Atherosclerotic heart disease of native coronary artery without angina pectoris: Secondary | ICD-10-CM | POA: Diagnosis not present

## 2021-07-28 MED ORDER — IOPAMIDOL (ISOVUE-300) INJECTION 61%
100.0000 mL | Freq: Once | INTRAVENOUS | Status: AC | PRN
  Administered 2021-07-28: 100 mL via INTRAVENOUS

## 2021-07-30 DIAGNOSIS — G4733 Obstructive sleep apnea (adult) (pediatric): Secondary | ICD-10-CM | POA: Diagnosis not present

## 2021-07-30 DIAGNOSIS — J441 Chronic obstructive pulmonary disease with (acute) exacerbation: Secondary | ICD-10-CM | POA: Diagnosis not present

## 2021-07-30 DIAGNOSIS — J9611 Chronic respiratory failure with hypoxia: Secondary | ICD-10-CM | POA: Diagnosis not present

## 2021-07-30 NOTE — Progress Notes (Signed)
?Cardiology Office Note:   ? ?Date:  07/31/2021  ? ?ID:  MALIKAH PRINCIPATO, DOB Dec 01, 1945, MRN [id number] ? ?PCP:  Crist Infante, MD  ? ?[facility] ?Cardiologist:  Lenna Sciara, MD ?Referring MD: Crist Infante, MD  ? ?Chief Complaint/Reason for Referral: Dyspnea ? ?ASSESSMENT:   ? ?1. Dyspnea, unspecified type   ?2. Aortic atherosclerosis ([place])   ?3. Coronary artery calcification seen on CAT scan   ?4. Hyperlipidemia, unspecified hyperlipidemia type   ?5. Hypertension, unspecified type   ?6. Bilateral carotid bruits   ? ? ?PLAN:   ? ?In order of problems listed above: ?1.  We will obtain echocardiogram to evaluate further.  She has multiple reasons to be short of breath.  Typically a cardiac etiology of shortness of breath does not result in hypoxia.  I suspect her dyspnea is likely due to a pulmonary etiology.  She has repeat PFTs soon.  We will keep follow-up open-ended depending on results of this test.   ?2.  Aspirin, statin, and strict blood pressure control ?3.  She is having no signs or symptoms of angina.  Continue to monitor for now. ?4.  Goal LDL is less than 70 given coronary artery calcification and aortic calcification. ?5.  BP under control today. ?6.  Will obtain carotid u/s. ? ?Dispo:  Return if symptoms worsen or fail to improve.  ? ?  ?Medication Adjustments/Labs and Tests Ordered: ?Current medicines are reviewed at length with the patient today.  Concerns regarding medicines are outlined above. ? ?The following changes have been made:  no change  ? ?Labs/tests ordered: ?Orders Placed This Encounter  ?Procedures  ? EKG 12-Lead  ? ECHOCARDIOGRAM COMPLETE  ? VAS US CAROTID  ? ? ?Medication Changes: ?No orders of the defined types were placed in this encounter. ? ? ?Current medicines are reviewed at length with the patient today.  The patient does not have concerns regarding medicines. ? ? ?History of Present Illness:   ? ?FOCUSED PROBLEM LIST:   ?1.  COPD and emphysema; FEV1 1.7L PFTs 2015 ?2.   Hypertension ?3.  Hyperlipidemia ?4.  BMI of 44 ?5.  OSA using CPAP ?6.  Aortic and coronary calcification on chest CT 2023 ?7.  Peripheral neuropathy, unknown etiology ? ?The patient is a 76 y.o. female with the indicated medical history here for dyspnea.  The patient was seen by her primary care provider recently.  She has had worsening shortness of breath and hypoxia.  Her saturations on room air are sometimes down to 70s and with rest up to the high 80s.  She gets short of breath with activities of daily living. ? ?She is here with her daughter.  She does not do much in a normal day.  She usually sits around and does some walking at her house.  Her shortness of breath is relatively unchanged over the last several years.  She does not wheeze.  She does not become short of breath at rest.  She denies any exertional angina.  She has had no presyncope or syncope.  She has had no severe bleeding.  She has had no signs or symptoms of stroke.  She denies any orthopnea or paroxysmal nocturnal dyspnea.  She is compliant with her medications. ? ?Of note she has lost 30 pounds by curbing her diet. ? ? ? ?Current Medications: ?Current Meds  ?Medication Sig  ? albuterol (PROVENTIL HFA;VENTOLIN HFA) 108 (90 Base) MCG/ACT inhaler Inhale 1 puff into the lungs every 6 (six)  hours as needed for wheezing or shortness of breath.  ? amLODipine (NORVASC) 5 MG tablet Take 5 mg by mouth daily.  ? aspirin 81 MG tablet Take 81 mg by mouth daily.    ? Budeson-Glycopyrrol-Formoterol (BREZTRI AEROSPHERE) 160-9-4.8 MCG/ACT AERO 2 (two) times daily.  ? cholecalciferol (VITAMIN D) 1000 UNITS tablet Take 4,000 Units by mouth daily.   ? cyanocobalamin 1000 MCG tablet Take 2,000 mcg by mouth daily.  ? gabapentin (NEURONTIN) 300 MG capsule Take 900 mg by mouth 3 (three) times daily.  ? rosuvastatin (CRESTOR) 20 MG tablet Take 20 mg by mouth daily.  ? telmisartan-hydrochlorothiazide (MICARDIS HCT) 80-25 MG tablet Take 1 tablet by mouth daily.  ?   ? ?Allergies:    ?Atorvastatin  ? ?Social History:   ?Social History  ? ?Tobacco Use  ? Smoking status: Former  ?  Packs/day: 1.00  ?  Years: 20.00  ?  Pack years: 20.00  ?  Types: Cigarettes  ?  Quit date: 04/27/1996  ?  Years since quitting: 25.2  ? Smokeless tobacco: Never  ?Vaping Use  ? Vaping Use: Never used  ?Substance Use Topics  ? Alcohol use: No  ?  Alcohol/week: 0.0 standard drinks  ? Drug use: No  ?  ? ?Family Hx: ?Family History  ?Problem Relation Age of Onset  ? Bowel Disease Mother   ? Lung cancer Father   ? Bone cancer Father   ? Hypertension Sister   ? Breast cancer Neg Hx   ?  ? ?Review of Systems:   ?Please see the history of present illness.    ?All other systems reviewed and are negative. ?  ? ? ?EKGs/Labs/Other Test Reviewed:   ? ?EKG:  EKG is ordered today that I personally reviewed demonstrates sinus rhythm. ? ?Previous ekg performed 2017 that I personally reviewed demonstrates sinus rhythm with left atrial enlargement. ? ?Prior CV studies: ? ?TTE 2020 ?- Left ventricle: The cavity size was normal. Systolic function was  ?  normal. The estimated ejection fraction was in the range of 60%  ?  to 65%. Wall motion was normal; there were no regional wall  ?  motion abnormalities. Left ventricular diastolic function  ?  parameters were normal.  ?- Atrial septum: No defect or patent foramen ovale was identified.  ? ?Other studies Reviewed: ?Review of the additional studies/records demonstrates:  ? ?Chest CT 2023 ?1. Coronary artery calcification and Aortic Atherosclerosis ?(ICD10-I70.0). ?2. Mild architectural distortion at the RIGHT lung apex. ?3. Minimal emphysematous change ?4. No significant interstitial lung disease. ?5. No suspicious nodularity. ? ?Recent Labs: ?External labs March 2023 demonstrate a sodium 142 potassium 4 BUN 17 creatinine 0.8, hemoglobin 14.5, platelets 214, cholesterol 100, triglycerides 118, HDL 30, LDL 46, TSH 2.41 ? ?Recent Lipid Panel ?No results found for: CHOL, TRIG,  HDL, LDLCALC, LDLDIRECT ? ?Risk Assessment/Calculations:   ? ? ?    ? ?Physical Exam:   ? ?VS:  BP 122/66   Pulse 63   Ht '5\' 5"'$  (1.651 m)   Wt 270 lb (122.5 kg)   SpO2 98%   BMI 44.93 kg/m?    ?Wt Readings from Last 3 Encounters:  ?07/31/21 270 lb (122.5 kg)  ?10/04/20 281 lb (127.5 kg)  ?07/19/19 300 lb (136.1 kg)  ?  ?GENERAL:  No apparent distress, AOx3 ?HEENT:  Bilateral carotid bruits, +2 carotid impulses, no scleral icterus ?CAR: RRR no murmurs, gallops, rubs, or thrills ?RES: Poor air movement  ?ABD:  Soft, nontender,  nondistended, positive bowel sounds x 4 ?VASC:  +2 radial pulses, +2 carotid pulses, palpable pedal pulses ?NEURO:  CN 2-12 grossly intact; motor and sensory grossly intact ?PSYCH:  No active depression or anxiety ?EXT:  No edema, ecchymosis, or cyanosis ? ?Signed, ?Early Osmond, MD  ?07/31/2021 3:11 PM    ?[facility] ?[place], [place], [place]  [address] ?Phone: [phone]; Fax: [phone]  ? ?Note:  This document was prepared using Dragon voice recognition software and may include unintentional dictation errors. ?

## 2021-07-31 ENCOUNTER — Ambulatory Visit: Payer: Medicare HMO | Admitting: Internal Medicine

## 2021-07-31 ENCOUNTER — Encounter: Payer: Self-pay | Admitting: Internal Medicine

## 2021-07-31 VITALS — BP 122/66 | HR 63 | Ht 65.0 in | Wt 270.0 lb

## 2021-07-31 DIAGNOSIS — R0600 Dyspnea, unspecified: Secondary | ICD-10-CM | POA: Diagnosis not present

## 2021-07-31 DIAGNOSIS — I10 Essential (primary) hypertension: Secondary | ICD-10-CM | POA: Diagnosis not present

## 2021-07-31 DIAGNOSIS — R0989 Other specified symptoms and signs involving the circulatory and respiratory systems: Secondary | ICD-10-CM | POA: Diagnosis not present

## 2021-07-31 DIAGNOSIS — E785 Hyperlipidemia, unspecified: Secondary | ICD-10-CM

## 2021-07-31 DIAGNOSIS — I2510 Atherosclerotic heart disease of native coronary artery without angina pectoris: Secondary | ICD-10-CM

## 2021-07-31 DIAGNOSIS — I700 Atherosclerosis of aorta: Secondary | ICD-10-CM

## 2021-07-31 NOTE — Patient Instructions (Signed)
Medication Instructions:  ?Your physician recommends that you continue on your current medications as directed. Please refer to the Current Medication list given to you today. ? ?*If you need a refill on your cardiac medications before your next appointment, please call your pharmacy* ? ?Lab Work: ?If you have labs (blood work) drawn today and your tests are completely normal, you will receive your results only by: ?MyChart Message (if you have MyChart) OR ?A paper copy in the mail ?If you have any lab test that is abnormal or we need to change your treatment, we will call you to review the results. ? ?Testing/Procedures: ?Your physician has requested that you have an echocardiogram. Echocardiography is a painless test that uses sound waves to create images of your heart. It provides your doctor with information about the size and shape of your heart and how well your heart?s chambers and valves are working. This procedure takes approximately one hour. There are no restrictions for this procedure. ? ?Your physician has requested that you have a carotid duplex. This test is an ultrasound of the carotid arteries in your neck. It looks at blood flow through these arteries that supply the brain with blood. Allow one hour for this exam. There are no restrictions or special instructions. ? ?Follow-Up: ?At [facility], you and your health needs are our priority.  As part of our continuing mission to provide you with exceptional heart care, we have created designated Provider Care Teams.  These Care Teams include your primary Cardiologist (physician) and Advanced Practice Providers (APPs -  Physician Assistants and Nurse Practitioners) who all work together to provide you with the care you need, when you need it. ? ?We recommend signing up for the patient portal called "MyChart".  Sign up information is provided on this After Visit Summary.  MyChart is used to connect with patients for Virtual Visits (Telemedicine).   Patients are able to view lab/test results, encounter notes, upcoming appointments, etc.  Non-urgent messages can be sent to your provider as well.   ?To learn more about what you can do with MyChart, go to [email or web address].   ? ?Your next appointment:   ?As needed ? ?The format for your next appointment:   ?In Person ? ?Provider:   ?Early Osmond, MD { ? ? ?

## 2021-08-14 ENCOUNTER — Institutional Professional Consult (permissible substitution): Payer: Medicare HMO | Admitting: Pulmonary Disease

## 2021-08-15 ENCOUNTER — Ambulatory Visit: Payer: Medicare HMO | Admitting: Student

## 2021-08-15 ENCOUNTER — Encounter: Payer: Self-pay | Admitting: Student

## 2021-08-15 VITALS — BP 124/60 | HR 75 | Temp 98.3°F | Ht 65.0 in | Wt 270.0 lb

## 2021-08-15 DIAGNOSIS — Z9989 Dependence on other enabling machines and devices: Secondary | ICD-10-CM | POA: Diagnosis not present

## 2021-08-15 DIAGNOSIS — J449 Chronic obstructive pulmonary disease, unspecified: Secondary | ICD-10-CM | POA: Diagnosis not present

## 2021-08-15 DIAGNOSIS — R0609 Other forms of dyspnea: Secondary | ICD-10-CM | POA: Diagnosis not present

## 2021-08-15 DIAGNOSIS — G4733 Obstructive sleep apnea (adult) (pediatric): Secondary | ICD-10-CM

## 2021-08-15 NOTE — Patient Instructions (Addendum)
-   continue breztri 2 puffs twice daily with spacer, rinse mouth and spacer after use ?- flutter valve 10 slow but firm puffs twice daily after each breztri treatment to clear out all the junk in your chest ?- PFTs (breathing tests) next visit in 4-6 weeks, see you then or sooner if need be! ? ?

## 2021-08-15 NOTE — Progress Notes (Signed)
? ?Synopsis: Referred for chronic hypoxic respiratory failure by Crist Infante, MD ? ?Subjective:  ? ?PATIENT ID: Cassandra Hart GENDER: female DOB: 1945-10-31, MRN: [id number] ? ?Chief Complaint  ?Patient presents with  ? Pulmonary Consult  ?  Referred by Dr. Crist Infante. Pt seen here in the past for COPD- Dr Halford Chessman in 2015 last. She has been having worsening SOB over the past several wks. She is using CPAP with 2lpm o2 and o2 as needed during the day. She gets winded walking room to room at home. She has occ cough with minimal clear sputum.   ? ?76yF with history of emphysema, ACOS previously followed by Dr. Halford Chessman, OSA on CPAP, HTN referred for chronic hypoxic respiratory failure. Smoked for 20-30 py, quit 1998.  ? ?Worsening shortness of breath with activity over last couple of weeks. Seen by cardiology 07/31/21 with plan for echo. She has some cough but pretty typical - occasionally productive. She is taking albuterol as needed and breztri 2 puffs twice daily (started a little over a year ago) uses without spacer but does rinse mouth with use. She does think the breztri is helpful for dyspnea. Her weight if anything has decreased this year by about 30 lb. Left ankle swelling for years. No CP.  ? ?She uses CPAP every night >>4h. She does feel a little sleepy during the day despite use of CPAP but she attributes that to gabapentin.  ? ?Otherwise pertinent review of systems is negative. ? ?Her mom had IPF ? 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  ? ?Past Medical History:  ?Diagnosis Date  ? Chronic obstructive asthma   ? Emphysema lung ([place])   ? Hyperlipidemia   ? Hypertension   ? Osteoporosis   ? Periodic limb movement   ? Sleep apnea   ? using CPAP  ? Uterine fibroid   ?  ? ?Family History  ?Problem Relation Age of Onset  ? Bowel Disease Mother   ? Lung cancer Father   ?     smoked  ? Bone cancer Father   ? Hypertension Sister   ? Breast cancer Neg Hx    ?  ? ?Past Surgical History:  ?Procedure Laterality Date  ? APPENDECTOMY    ? BILATERAL SALPINGOOPHORECTOMY  1998  ? KNEE ARTHROSCOPY    ? left  ? TOTAL ABDOMINAL HYSTERECTOMY  1998  ? ? ?Social History  ? ?Socioeconomic History  ? Marital status: Married  ?  Spouse name: Not on file  ? Number of children: 2  ? Years of education: 75  ? Highest education level: High school graduate  ?Occupational History  ? Occupation: medical records clerk  ?Tobacco Use  ? Smoking status: Former  ?  Packs/day: 1.00  ?  Years: 20.00  ?  Pack years: 20.00  ?  Types: Cigarettes  ?  Quit date: 04/27/1996  ?  Years since quitting: 25.3  ? Smokeless tobacco: Never  ?Vaping Use  ? Vaping Use: Never used  ?Substance and Sexual Activity  ? Alcohol use: No  ?  Alcohol/week: 0.0 standard drinks  ? Drug use: No  ? Sexual activity: Not on file  ?Other Topics Concern  ? Not on file  ?Social History Narrative  ? Right handed.  Married, 2 kids.  HS grad.  Caffeine 2 cups daily.    ? Lives with husband in a one story home.    ? Retired  from medical records at the [employer] in [place].   ? ?Social Determinants of Health  ? ?Financial Resource Strain: Not on file  ?Food Insecurity: Not on file  ?Transportation Needs: Not on file  ?Physical Activity: Not on file  ?Stress: Not on file  ?Social Connections: Not on file  ?Intimate Partner Violence: Not on file  ?  ? ?Allergies  ?Allergen Reactions  ? Atorvastatin Other (See Comments)  ?  ? ?Outpatient Medications Prior to Visit  ?Medication Sig Dispense Refill  ? albuterol (PROVENTIL HFA;VENTOLIN HFA) 108 (90 Base) MCG/ACT inhaler Inhale 1 puff into the lungs every 6 (six) hours as needed for wheezing or shortness of breath.    ? amLODipine (NORVASC) 5 MG tablet Take 5 mg by mouth daily.    ? aspirin 81 MG tablet Take 81 mg by mouth daily.      ? Budeson-Glycopyrrol-Formoterol (BREZTRI AEROSPHERE) 160-9-4.8 MCG/ACT AERO 2 (two) times daily.    ? cholecalciferol (VITAMIN D) 1000 UNITS tablet Take 4,000  Units by mouth daily.     ? cyanocobalamin 1000 MCG tablet Take 2,000 mcg by mouth daily.    ? gabapentin (NEURONTIN) 300 MG capsule Take 900 mg by mouth 3 (three) times daily.    ? rosuvastatin (CRESTOR) 20 MG tablet Take 20 mg by mouth daily.    ? telmisartan-hydrochlorothiazide (MICARDIS HCT) 80-25 MG tablet Take 1 tablet by mouth daily.  3  ? ?No facility-administered medications prior to visit.  ? ? ? ? ? ?Objective:  ? ?Physical Exam: ? ?General appearance: 76 y.o., female, NAD, conversant  ?Eyes: anicteric sclerae; PERRL, tracking appropriately ?HENT: NCAT; MMM ?Neck: Trachea midline; no lymphadenopathy, no JVD ?Lungs: CTAB, no crackles, no wheeze, with normal respiratory effort ?CV: RRR, no murmur  ?Abdomen: Soft, non-tender; non-distended, BS present  ?Extremities: No peripheral edema, warm ?Skin: Normal turgor and texture; no rash ?Psych: Appropriate affect ?Neuro: Alert and oriented to person and place, no focal deficit  ? ? ? ?Vitals:  ? 08/15/21 1330  ?BP: 124/60  ?Pulse: 75  ?Temp: 98.3 ?F (36.8 ?C)  ?TempSrc: Oral  ?SpO2: 91%  ?Weight: 270 lb (122.5 kg)  ?Height: '5\' 5"'$  (1.651 m)  ? ?91% on 2 LPM  ?BMI Readings from Last 3 Encounters:  ?08/15/21 44.93 kg/m?  ?07/31/21 44.93 kg/m?  ?10/04/20 46.76 kg/m?  ? ?Wt Readings from Last 3 Encounters:  ?08/15/21 270 lb (122.5 kg)  ?07/31/21 270 lb (122.5 kg)  ?10/04/20 281 lb (127.5 kg)  ? ? ? ?CBC ?   ?Component Value Date/Time  ? WBC 12.5 (H) 06/03/2015 0003  ? RBC 4.49 06/03/2015 0003  ? HGB 14.7 06/03/2015 0003  ? HCT 43.2 06/03/2015 0003  ? PLT 300 06/03/2015 0003  ? MCV 96.2 06/03/2015 0003  ? MCH 32.7 06/03/2015 0003  ? MCHC 34.0 06/03/2015 0003  ? RDW 13.5 06/03/2015 0003  ? LYMPHSABS 0.6 (L) 06/03/2015 0003  ? MONOABS 0.1 06/03/2015 0003  ? EOSABS 0.0 06/03/2015 0003  ? BASOSABS 0.0 06/03/2015 0003  ? ? ?Chest Imaging: ?CT Chest 07/28/21 reviewed by me with small focus of scar at right apex, bronchial wall thickening, a few foci of mucus plugging in  bases. Subtle emphysema.  ? ?Pulmonary Functions Testing Results: ? ?  Latest Ref Rng & Units 05/09/2013  ? 10:36 AM  ?PFT Results  ?FVC-Pre L 2.48    ?FVC-Predicted Pre % 76    ?FVC-Post L 2.42    ?FVC-Predicted Post % 74    ?  Pre FEV1/FVC % % 70    ?Post FEV1/FCV % % 71    ?FEV1-Pre L 1.74    ?FEV1-Predicted Pre % 70    ?FEV1-Post L 1.71    ?DLCO uncorrected ml/min/mmHg 22.63    ?DLCO UNC% % 88    ?DLVA Predicted % 96    ?TLC L 5.49    ?TLC % Predicted % 105    ?RV % Predicted % 117    ? ?PFT 2015 reviewed by me with mild obstruction no BD response, normal diffusing capacity ? ?CPAP titration 2016 at 305 lb with residual AHI of 0.7 on CPAP 12, lowest O2 saturation 88% ? ?Split night 2016 with AHI of 7.4, frequent desaturation ? ?Echocardiogram:  ? ?TTE 2020 normal ? ? ?   ?Assessment & Plan:  ? ?# Chronic hypoxic respiratory failure: ?On 2L O2 with exertion and sleep through [place] ? ?# Worsening DOE ?# ACOS ?# Severe OSA ?Prior PFTs with only mild ventilatory defect. She does appear to have mucus plugging and bronchial wall thickening on CT Chest and while she doesn't always expectorate sputum does have sensation of chest congestion and rhonchi on exam which clear with cough. While she doesn't have emphysema this mucus plugging and shunt could contribute to her O2 requirement. Worsening ACOS control/progression is one possible explanation of worsening DOE but other considerations include deconditioning, CHF (but weight actually down over the last year), suboptimally managed OSA but sounds like she's doing well on device, less likely angina.    ? ?Plan: ?- breztri 2 puffs twice daily with spacer, instructed in use, will rinse and gargle after each use ?- start flutter valve 10 slow but firm puffs twice daily after each breztri treatment ?- albuterol prn ?- check device download next visit, BMP ? ? ?RTC 4-6 weeks with PFT ? ?Maryjane Hurter, MD ?[facility] ?08/15/2021 1:37 PM  ? ?

## 2021-08-15 NOTE — Addendum Note (Signed)
Addended by: Elby Beck R on: 08/15/2021 02:18 PM ? ? Modules accepted: Orders ? ?

## 2021-08-22 ENCOUNTER — Ambulatory Visit (HOSPITAL_COMMUNITY): Payer: Medicare HMO | Attending: Cardiovascular Disease

## 2021-08-22 DIAGNOSIS — I2510 Atherosclerotic heart disease of native coronary artery without angina pectoris: Secondary | ICD-10-CM | POA: Diagnosis not present

## 2021-08-22 DIAGNOSIS — R0600 Dyspnea, unspecified: Secondary | ICD-10-CM | POA: Diagnosis not present

## 2021-08-22 DIAGNOSIS — I700 Atherosclerosis of aorta: Secondary | ICD-10-CM | POA: Diagnosis not present

## 2021-08-22 DIAGNOSIS — I10 Essential (primary) hypertension: Secondary | ICD-10-CM | POA: Diagnosis not present

## 2021-08-22 DIAGNOSIS — E785 Hyperlipidemia, unspecified: Secondary | ICD-10-CM

## 2021-08-22 DIAGNOSIS — R0989 Other specified symptoms and signs involving the circulatory and respiratory systems: Secondary | ICD-10-CM | POA: Diagnosis not present

## 2021-08-22 LAB — ECHOCARDIOGRAM COMPLETE
Area-P 1/2: 2.9 cm2
S' Lateral: 2.8 cm

## 2021-08-25 ENCOUNTER — Other Ambulatory Visit: Payer: Self-pay | Admitting: Internal Medicine

## 2021-08-25 DIAGNOSIS — R0989 Other specified symptoms and signs involving the circulatory and respiratory systems: Secondary | ICD-10-CM

## 2021-08-26 DIAGNOSIS — J9611 Chronic respiratory failure with hypoxia: Secondary | ICD-10-CM | POA: Diagnosis not present

## 2021-08-26 DIAGNOSIS — G4733 Obstructive sleep apnea (adult) (pediatric): Secondary | ICD-10-CM | POA: Diagnosis not present

## 2021-08-26 DIAGNOSIS — J441 Chronic obstructive pulmonary disease with (acute) exacerbation: Secondary | ICD-10-CM | POA: Diagnosis not present

## 2021-08-29 ENCOUNTER — Ambulatory Visit (HOSPITAL_COMMUNITY)
Admission: RE | Admit: 2021-08-29 | Discharge: 2021-08-29 | Disposition: A | Discharge status: Home / Self Care | Payer: Medicare HMO | Source: Ambulatory Visit | Attending: Cardiovascular Disease | Admitting: Cardiovascular Disease

## 2021-08-29 DIAGNOSIS — J441 Chronic obstructive pulmonary disease with (acute) exacerbation: Secondary | ICD-10-CM | POA: Diagnosis not present

## 2021-08-29 DIAGNOSIS — R0989 Other specified symptoms and signs involving the circulatory and respiratory systems: Secondary | ICD-10-CM | POA: Diagnosis not present

## 2021-08-29 DIAGNOSIS — G4733 Obstructive sleep apnea (adult) (pediatric): Secondary | ICD-10-CM | POA: Diagnosis not present

## 2021-08-29 DIAGNOSIS — J9611 Chronic respiratory failure with hypoxia: Secondary | ICD-10-CM | POA: Diagnosis not present

## 2021-09-26 DIAGNOSIS — G4733 Obstructive sleep apnea (adult) (pediatric): Secondary | ICD-10-CM | POA: Diagnosis not present

## 2021-09-26 DIAGNOSIS — J441 Chronic obstructive pulmonary disease with (acute) exacerbation: Secondary | ICD-10-CM | POA: Diagnosis not present

## 2021-09-26 DIAGNOSIS — J9611 Chronic respiratory failure with hypoxia: Secondary | ICD-10-CM | POA: Diagnosis not present

## 2021-09-29 DIAGNOSIS — G4733 Obstructive sleep apnea (adult) (pediatric): Secondary | ICD-10-CM | POA: Diagnosis not present

## 2021-09-29 DIAGNOSIS — J9611 Chronic respiratory failure with hypoxia: Secondary | ICD-10-CM | POA: Diagnosis not present

## 2021-09-29 DIAGNOSIS — J441 Chronic obstructive pulmonary disease with (acute) exacerbation: Secondary | ICD-10-CM | POA: Diagnosis not present

## 2021-10-10 ENCOUNTER — Ambulatory Visit: Payer: Medicare HMO | Admitting: Student

## 2021-10-23 DIAGNOSIS — J9611 Chronic respiratory failure with hypoxia: Secondary | ICD-10-CM | POA: Diagnosis not present

## 2021-10-23 DIAGNOSIS — J441 Chronic obstructive pulmonary disease with (acute) exacerbation: Secondary | ICD-10-CM | POA: Diagnosis not present

## 2021-10-23 DIAGNOSIS — M810 Age-related osteoporosis without current pathological fracture: Secondary | ICD-10-CM | POA: Diagnosis not present

## 2021-10-23 DIAGNOSIS — E1169 Type 2 diabetes mellitus with other specified complication: Secondary | ICD-10-CM | POA: Diagnosis not present

## 2021-10-23 DIAGNOSIS — G4733 Obstructive sleep apnea (adult) (pediatric): Secondary | ICD-10-CM | POA: Diagnosis not present

## 2021-10-23 DIAGNOSIS — J45909 Unspecified asthma, uncomplicated: Secondary | ICD-10-CM | POA: Diagnosis not present

## 2021-10-24 DIAGNOSIS — E1169 Type 2 diabetes mellitus with other specified complication: Secondary | ICD-10-CM | POA: Diagnosis not present

## 2021-10-24 DIAGNOSIS — I10 Essential (primary) hypertension: Secondary | ICD-10-CM | POA: Diagnosis not present

## 2021-10-24 DIAGNOSIS — J441 Chronic obstructive pulmonary disease with (acute) exacerbation: Secondary | ICD-10-CM | POA: Diagnosis not present

## 2021-10-26 DIAGNOSIS — J9611 Chronic respiratory failure with hypoxia: Secondary | ICD-10-CM | POA: Diagnosis not present

## 2021-10-26 DIAGNOSIS — J441 Chronic obstructive pulmonary disease with (acute) exacerbation: Secondary | ICD-10-CM | POA: Diagnosis not present

## 2021-10-26 DIAGNOSIS — G4733 Obstructive sleep apnea (adult) (pediatric): Secondary | ICD-10-CM | POA: Diagnosis not present

## 2021-10-29 DIAGNOSIS — J9611 Chronic respiratory failure with hypoxia: Secondary | ICD-10-CM | POA: Diagnosis not present

## 2021-10-29 DIAGNOSIS — J441 Chronic obstructive pulmonary disease with (acute) exacerbation: Secondary | ICD-10-CM | POA: Diagnosis not present

## 2021-10-29 DIAGNOSIS — G4733 Obstructive sleep apnea (adult) (pediatric): Secondary | ICD-10-CM | POA: Diagnosis not present

## 2021-10-31 DIAGNOSIS — H40013 Open angle with borderline findings, low risk, bilateral: Secondary | ICD-10-CM | POA: Diagnosis not present

## 2021-11-05 DIAGNOSIS — I10 Essential (primary) hypertension: Secondary | ICD-10-CM | POA: Diagnosis not present

## 2021-11-05 DIAGNOSIS — J449 Chronic obstructive pulmonary disease, unspecified: Secondary | ICD-10-CM | POA: Diagnosis not present

## 2021-11-05 DIAGNOSIS — G4733 Obstructive sleep apnea (adult) (pediatric): Secondary | ICD-10-CM | POA: Diagnosis not present

## 2021-11-24 DIAGNOSIS — E1169 Type 2 diabetes mellitus with other specified complication: Secondary | ICD-10-CM | POA: Diagnosis not present

## 2021-11-24 DIAGNOSIS — I10 Essential (primary) hypertension: Secondary | ICD-10-CM | POA: Diagnosis not present

## 2021-11-24 DIAGNOSIS — J441 Chronic obstructive pulmonary disease with (acute) exacerbation: Secondary | ICD-10-CM | POA: Diagnosis not present

## 2021-11-26 DIAGNOSIS — G4733 Obstructive sleep apnea (adult) (pediatric): Secondary | ICD-10-CM | POA: Diagnosis not present

## 2021-11-26 DIAGNOSIS — J9611 Chronic respiratory failure with hypoxia: Secondary | ICD-10-CM | POA: Diagnosis not present

## 2021-11-26 DIAGNOSIS — J441 Chronic obstructive pulmonary disease with (acute) exacerbation: Secondary | ICD-10-CM | POA: Diagnosis not present

## 2021-11-29 DIAGNOSIS — J9611 Chronic respiratory failure with hypoxia: Secondary | ICD-10-CM | POA: Diagnosis not present

## 2021-11-29 DIAGNOSIS — G4733 Obstructive sleep apnea (adult) (pediatric): Secondary | ICD-10-CM | POA: Diagnosis not present

## 2021-11-29 DIAGNOSIS — J441 Chronic obstructive pulmonary disease with (acute) exacerbation: Secondary | ICD-10-CM | POA: Diagnosis not present

## 2021-12-17 NOTE — Progress Notes (Signed)
Synopsis: Referred for chronic hypoxic respiratory failure by Crist Infante, MD  Subjective:   PATIENT ID: Cassandra Hart GENDER: female DOB: 1945-09-05, MRN: [id number]  Chief Complaint  Patient presents with   Follow-up    Breathing has improved with using breztri with spacer. She is using her albuterol less, maybe 1-2 x per wk. She has occ cough with clear sputum.    4yF with history of emphysema, ACOS previously followed by Dr. Halford Chessman, OSA on CPAP, HTN referred for chronic hypoxic respiratory failure. Smoked for 20-30 py, quit 1998.   Worsening shortness of breath with activity over last couple of weeks. Seen by cardiology 07/31/21 with plan for echo. She has some cough but pretty typical - occasionally productive. She is taking albuterol as needed and breztri 2 puffs twice daily (started a little over a year ago) uses without spacer but does rinse mouth with use. She does think the breztri is helpful for dyspnea. Her weight if anything has decreased this year by about 30 lb. Left ankle swelling for years. No CP.   She uses CPAP every night >>4h. She does feel a little sleepy during the day despite use of CPAP but she attributes that to gabapentin.   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   Interval HPI: Started on breztri and flutter last visit, PFTs today with moderately severe obstruction without BD effect, says she's doing fine today. Has not needed steroids or ABX since last visit. Rinsing mouth after using breztri diligently.   Planned to review device download from her CPAP - uses every night, more than 4h each night. Dr. Joylene Draft follows her for this.   Otherwise pertinent review of systems is negative. Past Medical History:  Diagnosis Date   Chronic obstructive asthma    Emphysema lung (HCC)    Hyperlipidemia    Hypertension    Osteoporosis    Periodic limb movement    Sleep apnea    using  CPAP   Uterine fibroid      Family History  Problem Relation Age of Onset   Bowel Disease Mother    Lung cancer Father        smoked   Bone cancer Father    Hypertension Sister    Breast cancer Neg Hx      Past Surgical History:  Procedure Laterality Date   APPENDECTOMY     BILATERAL SALPINGOOPHORECTOMY  1998   KNEE ARTHROSCOPY     left   TOTAL ABDOMINAL HYSTERECTOMY  1998    Social History   Socioeconomic History   Marital status: Married    Spouse name: Not on file   Number of children: 2   Years of education: 12   Highest education level: High school graduate  Occupational History   Occupation: medical records clerk  Tobacco Use   Smoking status: Former    Packs/day: 1.00    Years: 20.00    Total pack years: 20.00    Types: Cigarettes    Quit date: 04/27/1996    Years since quitting: 25.6   Smokeless tobacco: Never  Vaping Use   Vaping Use: Never used  Substance and Sexual Activity   Alcohol use: No    Alcohol/week: 0.0 standard drinks of alcohol   Drug use: No   Sexual activity: Not on file  Other Topics Concern   Not on file  Social History Narrative  Right handed.  Married, 2 kids.  HS grad.  Caffeine 2 cups daily.     Lives with husband in a one story home.     Retired from medical records at [employer] in [place].    Social Determinants of Health   Financial Resource Strain: Not on file  Food Insecurity: Not on file  Transportation Needs: Not on file  Physical Activity: Not on file  Stress: Not on file  Social Connections: Not on file  Intimate Partner Violence: Not on file     Allergies  Allergen Reactions   Atorvastatin Other (See Comments)     Outpatient Medications Prior to Visit  Medication Sig Dispense Refill   albuterol (PROVENTIL HFA;VENTOLIN HFA) 108 (90 Base) MCG/ACT inhaler Inhale 1 puff into the lungs every 6 (six) hours as needed for wheezing or shortness of breath.     amLODipine (NORVASC) 5 MG tablet Take 5 mg by  mouth daily.     aspirin 81 MG tablet Take 81 mg by mouth daily.       Budeson-Glycopyrrol-Formoterol (BREZTRI AEROSPHERE) 160-9-4.8 MCG/ACT AERO 2 (two) times daily.     cholecalciferol (VITAMIN D) 1000 UNITS tablet Take 2,000 Units by mouth daily.     cyanocobalamin 1000 MCG tablet Take 2,000 mcg by mouth daily.     gabapentin (NEURONTIN) 300 MG capsule Take 900 mg by mouth 3 (three) times daily.     rosuvastatin (CRESTOR) 20 MG tablet Take 20 mg by mouth daily.     telmisartan-hydrochlorothiazide (MICARDIS HCT) 80-25 MG tablet Take 1 tablet by mouth daily.  3   No facility-administered medications prior to visit.       Objective:   Physical Exam:  General appearance: 76 y.o., female, NAD, conversant  Eyes: anicteric sclerae; PERRL, tracking appropriately HENT: NCAT; MMM Neck: Trachea midline; no lymphadenopathy, no JVD Lungs: CTAB, no crackles, no wheeze, with normal respiratory effort CV: RRR, no murmur  Abdomen: Soft, non-tender; non-distended, BS present  Extremities: No peripheral edema, warm Skin: Normal turgor and texture; no rash Psych: Appropriate affect Neuro: Alert and oriented to person and place, no focal deficit     Vitals:   12/19/21 1103  BP: 122/60  Pulse: 61  Temp: 98 F (36.7 C)  TempSrc: Oral  SpO2: 93%  Weight: 274 lb (124.3 kg)  Height: '5\' 5"'$  (1.651 m)    93% on 2 LPM  BMI Readings from Last 3 Encounters:  12/19/21 45.60 kg/m  08/15/21 44.93 kg/m  07/31/21 44.93 kg/m   Wt Readings from Last 3 Encounters:  12/19/21 274 lb (124.3 kg)  08/15/21 270 lb (122.5 kg)  07/31/21 270 lb (122.5 kg)     CBC    Component Value Date/Time   WBC 12.5 (H) 06/03/2015 0003   RBC 4.49 06/03/2015 0003   HGB 14.7 06/03/2015 0003   HCT 43.2 06/03/2015 0003   PLT 300 06/03/2015 0003   MCV 96.2 06/03/2015 0003   MCH 32.7 06/03/2015 0003   MCHC 34.0 06/03/2015 0003   RDW 13.5 06/03/2015 0003   LYMPHSABS 0.6 (L) 06/03/2015 0003   MONOABS 0.1  06/03/2015 0003   EOSABS 0.0 06/03/2015 0003   BASOSABS 0.0 06/03/2015 0003    Chest Imaging: CT Chest 07/28/21 reviewed by me with small focus of scar at right apex, bronchial wall thickening, a few foci of mucus plugging in bases. Subtle emphysema.   Pulmonary Functions Testing Results:    Latest Ref Rng & Units 12/19/2021  9:35 AM 05/09/2013   10:36 AM  PFT Results  FVC-Pre L 2.22  P 2.48   FVC-Predicted Pre % 75  P 76   FVC-Post L 2.30  P 2.42   FVC-Predicted Post % 78  P 74   Pre FEV1/FVC % % 56  P 70   Post FEV1/FCV % % 54  P 71   FEV1-Pre L 1.25  P 1.74   FEV1-Predicted Pre % 56  P 70   FEV1-Post L 1.24  P 1.71   DLCO uncorrected ml/min/mmHg 15.90  P 22.63   DLCO UNC% % 80  P 88   DLCO corrected ml/min/mmHg 15.90  P   DLCO COR %Predicted % 80  P   DLVA Predicted % 87  P 96   TLC L 6.19  P 5.49   TLC % Predicted % 118  P 105   RV % Predicted % 154  P 117     P Preliminary result   PFT 12/19/21 reviewed by me with moderately severe obstruction, no BD response, air trapping, normal diffusing capacity  PFT 2015 reviewed by me with mild obstruction no BD response, normal diffusing capacity  CPAP titration 2016 at 305 lb with residual AHI of 0.7 on CPAP 12, lowest O2 saturation 88%  Split night 2016 with AHI of 7.4, frequent desaturation  Echocardiogram:   TTE 2020 normal  TTE 08/22/21:  1. Left ventricular ejection fraction, by estimation, is 60 to 65%. The  left ventricle has normal function. The left ventricle has no regional  wall motion abnormalities. Left ventricular diastolic parameters are  consistent with Grade I diastolic  dysfunction (impaired relaxation).   2. Right ventricular systolic function is normal. The right ventricular  size is normal. There is normal pulmonary artery systolic pressure.   3. Right atrial size was mildly dilated.   4. The mitral valve is normal in structure. Trivial mitral valve  regurgitation. No evidence of mitral stenosis.    5. The aortic valve is tricuspid. Aortic valve regurgitation is not  visualized. No aortic stenosis is present.   6. The inferior vena cava is normal in size with greater than 50%  respiratory variability, suggesting right atrial pressure of 3 mmHg.     Assessment & Plan:   # Chronic hypoxic respiratory failure: On 2L O2 with exertion and sleep through [place]  # Worsening DOE # ACOS # Severe OSA Prior PFTs with only mild ventilatory defect. She does appear to have mucus plugging and bronchial wall thickening on CT Chest and while she doesn't always expectorate sputum does have sensation of chest congestion and rhonchi on exam which clear with cough. While she doesn't have emphysema this mucus plugging and shunt could contribute to her O2 requirement. Although she has had lung function decline which may explain her deterioration, she has responded surprisingly well to addition of spacer.   Plan: - breztri 2 puffs twice daily with spacer, instructed in use, will rinse and gargle after each use - start flutter valve 10 slow but firm puffs twice daily after each breztri treatment, mucinex when dealing with chest congestion/cough - albuterol prn - RTC as needed    Maryjane Hurter, MD [facility] 12/19/2021 11:20 AM

## 2021-12-19 ENCOUNTER — Ambulatory Visit: Payer: Medicare HMO | Admitting: Student

## 2021-12-19 ENCOUNTER — Ambulatory Visit (INDEPENDENT_AMBULATORY_CARE_PROVIDER_SITE_OTHER): Payer: Medicare HMO | Admitting: Student

## 2021-12-19 ENCOUNTER — Encounter: Payer: Self-pay | Admitting: Student

## 2021-12-19 VITALS — BP 122/60 | HR 61 | Temp 98.0°F | Ht 65.0 in | Wt 274.0 lb

## 2021-12-19 DIAGNOSIS — J9611 Chronic respiratory failure with hypoxia: Secondary | ICD-10-CM

## 2021-12-19 DIAGNOSIS — R0609 Other forms of dyspnea: Secondary | ICD-10-CM | POA: Diagnosis not present

## 2021-12-19 DIAGNOSIS — J449 Chronic obstructive pulmonary disease, unspecified: Secondary | ICD-10-CM

## 2021-12-19 LAB — PULMONARY FUNCTION TEST
DL/VA % pred: 87 %
DL/VA: 3.58 ml/min/mmHg/L
DLCO cor % pred: 80 %
DLCO cor: 15.9 ml/min/mmHg
DLCO unc % pred: 80 %
DLCO unc: 15.9 ml/min/mmHg
FEF 25-75 Post: 0.6 L/sec
FEF 25-75 Pre: 0.58 L/sec
FEF2575-%Change-Post: 4 %
FEF2575-%Pred-Post: 35 %
FEF2575-%Pred-Pre: 33 %
FEV1-%Change-Post: 0 %
FEV1-%Pred-Post: 56 %
FEV1-%Pred-Pre: 56 %
FEV1-Post: 1.24 L
FEV1-Pre: 1.25 L
FEV1FVC-%Change-Post: -4 %
FEV1FVC-%Pred-Pre: 75 %
FEV6-%Change-Post: 0 %
FEV6-%Pred-Post: 78 %
FEV6-%Pred-Pre: 77 %
FEV6-Post: 2.18 L
FEV6-Pre: 2.17 L
FEV6FVC-%Change-Post: 0 %
FEV6FVC-%Pred-Post: 103 %
FEV6FVC-%Pred-Pre: 103 %
FVC-%Change-Post: 3 %
FVC-%Pred-Post: 78 %
FVC-%Pred-Pre: 75 %
FVC-Post: 2.3 L
FVC-Pre: 2.22 L
Post FEV1/FVC ratio: 54 %
Post FEV6/FVC ratio: 99 %
Pre FEV1/FVC ratio: 56 %
Pre FEV6/FVC Ratio: 98 %
RV % pred: 154 %
RV: 3.61 L
TLC % pred: 118 %
TLC: 6.19 L

## 2021-12-19 NOTE — Patient Instructions (Addendum)
-   continue breztri 2 puffs twice daily with spacer, rinse mouth and spacer after use - Any trouble breathing, worsening cough, or another COPD exacerbation - I would suggest calling our clinic or sending my chart message to arrange a follow up with [name]

## 2021-12-19 NOTE — Progress Notes (Signed)
PFT done today. 

## 2021-12-27 DIAGNOSIS — J441 Chronic obstructive pulmonary disease with (acute) exacerbation: Secondary | ICD-10-CM | POA: Diagnosis not present

## 2021-12-27 DIAGNOSIS — J9611 Chronic respiratory failure with hypoxia: Secondary | ICD-10-CM | POA: Diagnosis not present

## 2021-12-27 DIAGNOSIS — G4733 Obstructive sleep apnea (adult) (pediatric): Secondary | ICD-10-CM | POA: Diagnosis not present

## 2021-12-30 DIAGNOSIS — G4733 Obstructive sleep apnea (adult) (pediatric): Secondary | ICD-10-CM | POA: Diagnosis not present

## 2021-12-30 DIAGNOSIS — J9611 Chronic respiratory failure with hypoxia: Secondary | ICD-10-CM | POA: Diagnosis not present

## 2021-12-30 DIAGNOSIS — J441 Chronic obstructive pulmonary disease with (acute) exacerbation: Secondary | ICD-10-CM | POA: Diagnosis not present

## 2022-01-13 ENCOUNTER — Ambulatory Visit: Payer: Self-pay

## 2022-01-13 NOTE — Patient Instructions (Signed)
Visit Information  Thank you for taking time to visit with me today. Please don't hesitate to contact me if I can be of assistance to you.   Following are the goals we discussed today:   Goals Addressed             This Visit's Progress    COMPLETED: Care Coordination Activities       Care Coordination Interventions: SDoH screening completed - no acute resource challenges identified Determined the patients CPAP machine is not transmitting appropriately - new machine has been ordered and a respiratory therapist is planning to visit the home to assist with programing needed settings Discussed the patients oxygen concentrator is not working well - [facility] to send a technician out to service machine Reviewed patient has an upcomming primary care provider appointment in the next month - family will request new DME orders if needed Reviewed role of care coordination team - no follow up desired at this time Instructed patients daughter to contact the patients primary care provider as needed         Please call the care guide team at [phone] if you need to schedule an appointment with our care coordination team.  If you are experiencing a Mental Health or [place] or need someone to talk to, please call [phone]-TALK (toll free, 24 hour hotline)  Patient verbalizes understanding of instructions and care plan provided today and agrees to view in [place]. Active MyChart status and patient understanding of how to access instructions and care plan via MyChart confirmed with patient.     No further follow up required: Please contact your primary care provider as needed.  Daneen Schick, BSW, CDP Social Worker, Certified Dementia Practitioner [facility]  Care Coordination [phone]

## 2022-01-13 NOTE — Patient Outreach (Signed)
  Care Coordination   Initial Visit Note   01/13/2022 Name: Cassandra Hart MRN: [id number] DOB: December 28, 1945  Cassandra Hart is a 76 y.o. year old female who sees Crist Infante, MD for primary care. I  spoke with patients daughter [name] by phone today.  What matters to the patients health and wellness today?  To receive necessary repairs to oxygen concentrator and CPAP machine.    Goals Addressed             This Visit's Progress    COMPLETED: Care Coordination Activities       Care Coordination Interventions: SDoH screening completed - no acute resource challenges identified Determined the patients CPAP machine is not transmitting appropriately - new machine has been ordered and a respiratory therapist is planning to visit the home to assist with programing needed settings Discussed the patients oxygen concentrator is not working well - [facility] to send a technician out to service machine Reviewed patient has an upcomming primary care provider appointment in the next month - family will request new DME orders if needed Reviewed role of care coordination team - no follow up desired at this time Instructed patients daughter to contact the patients primary care provider as needed         SDOH assessments and interventions completed:  Yes  SDOH Interventions Today    Flowsheet Row Most Recent Value  SDOH Interventions   Food Insecurity Interventions Intervention Not Indicated  Housing Interventions Intervention Not Indicated  Transportation Interventions Intervention Not Indicated  Utilities Interventions Intervention Not Indicated  Financial Strain Interventions Intervention Not Indicated        Care Coordination Interventions Activated:  Yes  Care Coordination Interventions:  Yes, provided   Follow up plan: No further intervention required.   Encounter Outcome:  Pt. Visit Completed   Daneen Schick, BSW, CDP Social Worker, Certified Dementia Practitioner [facility]  Care Coordination [phone]

## 2022-01-26 DIAGNOSIS — J441 Chronic obstructive pulmonary disease with (acute) exacerbation: Secondary | ICD-10-CM | POA: Diagnosis not present

## 2022-01-26 DIAGNOSIS — G4733 Obstructive sleep apnea (adult) (pediatric): Secondary | ICD-10-CM | POA: Diagnosis not present

## 2022-01-26 DIAGNOSIS — J9611 Chronic respiratory failure with hypoxia: Secondary | ICD-10-CM | POA: Diagnosis not present

## 2022-01-29 DIAGNOSIS — J441 Chronic obstructive pulmonary disease with (acute) exacerbation: Secondary | ICD-10-CM | POA: Diagnosis not present

## 2022-01-29 DIAGNOSIS — J9611 Chronic respiratory failure with hypoxia: Secondary | ICD-10-CM | POA: Diagnosis not present

## 2022-01-29 DIAGNOSIS — G4733 Obstructive sleep apnea (adult) (pediatric): Secondary | ICD-10-CM | POA: Diagnosis not present

## 2022-02-20 DIAGNOSIS — E1169 Type 2 diabetes mellitus with other specified complication: Secondary | ICD-10-CM | POA: Diagnosis not present

## 2022-02-20 DIAGNOSIS — J441 Chronic obstructive pulmonary disease with (acute) exacerbation: Secondary | ICD-10-CM | POA: Diagnosis not present

## 2022-02-20 DIAGNOSIS — I2510 Atherosclerotic heart disease of native coronary artery without angina pectoris: Secondary | ICD-10-CM | POA: Diagnosis not present

## 2022-02-20 DIAGNOSIS — J9611 Chronic respiratory failure with hypoxia: Secondary | ICD-10-CM | POA: Diagnosis not present

## 2022-02-20 DIAGNOSIS — E785 Hyperlipidemia, unspecified: Secondary | ICD-10-CM | POA: Diagnosis not present

## 2022-02-20 DIAGNOSIS — R0609 Other forms of dyspnea: Secondary | ICD-10-CM | POA: Diagnosis not present

## 2022-02-20 DIAGNOSIS — L80 Vitiligo: Secondary | ICD-10-CM | POA: Diagnosis not present

## 2022-02-20 DIAGNOSIS — G4733 Obstructive sleep apnea (adult) (pediatric): Secondary | ICD-10-CM | POA: Diagnosis not present

## 2022-02-20 DIAGNOSIS — M810 Age-related osteoporosis without current pathological fracture: Secondary | ICD-10-CM | POA: Diagnosis not present

## 2022-02-20 DIAGNOSIS — J449 Chronic obstructive pulmonary disease, unspecified: Secondary | ICD-10-CM | POA: Diagnosis not present

## 2022-02-20 DIAGNOSIS — Z23 Encounter for immunization: Secondary | ICD-10-CM | POA: Diagnosis not present

## 2022-02-20 DIAGNOSIS — J45909 Unspecified asthma, uncomplicated: Secondary | ICD-10-CM | POA: Diagnosis not present

## 2022-02-20 DIAGNOSIS — I10 Essential (primary) hypertension: Secondary | ICD-10-CM | POA: Diagnosis not present

## 2022-02-20 DIAGNOSIS — G609 Hereditary and idiopathic neuropathy, unspecified: Secondary | ICD-10-CM | POA: Diagnosis not present

## 2022-02-26 DIAGNOSIS — J441 Chronic obstructive pulmonary disease with (acute) exacerbation: Secondary | ICD-10-CM | POA: Diagnosis not present

## 2022-02-26 DIAGNOSIS — G4733 Obstructive sleep apnea (adult) (pediatric): Secondary | ICD-10-CM | POA: Diagnosis not present

## 2022-02-26 DIAGNOSIS — J9611 Chronic respiratory failure with hypoxia: Secondary | ICD-10-CM | POA: Diagnosis not present

## 2022-03-01 DIAGNOSIS — G4733 Obstructive sleep apnea (adult) (pediatric): Secondary | ICD-10-CM | POA: Diagnosis not present

## 2022-03-01 DIAGNOSIS — J441 Chronic obstructive pulmonary disease with (acute) exacerbation: Secondary | ICD-10-CM | POA: Diagnosis not present

## 2022-03-01 DIAGNOSIS — J9611 Chronic respiratory failure with hypoxia: Secondary | ICD-10-CM | POA: Diagnosis not present

## 2022-03-28 DIAGNOSIS — J441 Chronic obstructive pulmonary disease with (acute) exacerbation: Secondary | ICD-10-CM | POA: Diagnosis not present

## 2022-03-28 DIAGNOSIS — G4733 Obstructive sleep apnea (adult) (pediatric): Secondary | ICD-10-CM | POA: Diagnosis not present

## 2022-03-28 DIAGNOSIS — J9611 Chronic respiratory failure with hypoxia: Secondary | ICD-10-CM | POA: Diagnosis not present

## 2022-03-31 DIAGNOSIS — J9611 Chronic respiratory failure with hypoxia: Secondary | ICD-10-CM | POA: Diagnosis not present

## 2022-03-31 DIAGNOSIS — J441 Chronic obstructive pulmonary disease with (acute) exacerbation: Secondary | ICD-10-CM | POA: Diagnosis not present

## 2022-03-31 DIAGNOSIS — G4733 Obstructive sleep apnea (adult) (pediatric): Secondary | ICD-10-CM | POA: Diagnosis not present

## 2022-04-03 DIAGNOSIS — H353131 Nonexudative age-related macular degeneration, bilateral, early dry stage: Secondary | ICD-10-CM | POA: Diagnosis not present

## 2022-04-03 DIAGNOSIS — H40013 Open angle with borderline findings, low risk, bilateral: Secondary | ICD-10-CM | POA: Diagnosis not present

## 2022-04-28 DIAGNOSIS — J441 Chronic obstructive pulmonary disease with (acute) exacerbation: Secondary | ICD-10-CM | POA: Diagnosis not present

## 2022-04-28 DIAGNOSIS — G4733 Obstructive sleep apnea (adult) (pediatric): Secondary | ICD-10-CM | POA: Diagnosis not present

## 2022-04-28 DIAGNOSIS — J9611 Chronic respiratory failure with hypoxia: Secondary | ICD-10-CM | POA: Diagnosis not present

## 2022-05-01 DIAGNOSIS — J9611 Chronic respiratory failure with hypoxia: Secondary | ICD-10-CM | POA: Diagnosis not present

## 2022-05-01 DIAGNOSIS — G4733 Obstructive sleep apnea (adult) (pediatric): Secondary | ICD-10-CM | POA: Diagnosis not present

## 2022-05-01 DIAGNOSIS — J441 Chronic obstructive pulmonary disease with (acute) exacerbation: Secondary | ICD-10-CM | POA: Diagnosis not present

## 2022-05-29 DIAGNOSIS — G4733 Obstructive sleep apnea (adult) (pediatric): Secondary | ICD-10-CM | POA: Diagnosis not present

## 2022-05-29 DIAGNOSIS — J441 Chronic obstructive pulmonary disease with (acute) exacerbation: Secondary | ICD-10-CM | POA: Diagnosis not present

## 2022-05-29 DIAGNOSIS — J9611 Chronic respiratory failure with hypoxia: Secondary | ICD-10-CM | POA: Diagnosis not present

## 2022-06-01 DIAGNOSIS — G4733 Obstructive sleep apnea (adult) (pediatric): Secondary | ICD-10-CM | POA: Diagnosis not present

## 2022-06-01 DIAGNOSIS — J9611 Chronic respiratory failure with hypoxia: Secondary | ICD-10-CM | POA: Diagnosis not present

## 2022-06-01 DIAGNOSIS — J441 Chronic obstructive pulmonary disease with (acute) exacerbation: Secondary | ICD-10-CM | POA: Diagnosis not present

## 2022-06-09 DIAGNOSIS — G4733 Obstructive sleep apnea (adult) (pediatric): Secondary | ICD-10-CM | POA: Diagnosis not present

## 2022-06-09 DIAGNOSIS — J449 Chronic obstructive pulmonary disease, unspecified: Secondary | ICD-10-CM | POA: Diagnosis not present

## 2022-06-09 DIAGNOSIS — I10 Essential (primary) hypertension: Secondary | ICD-10-CM | POA: Diagnosis not present

## 2022-06-26 DIAGNOSIS — Z0100 Encounter for examination of eyes and vision without abnormal findings: Secondary | ICD-10-CM | POA: Diagnosis not present

## 2022-06-27 DIAGNOSIS — J9611 Chronic respiratory failure with hypoxia: Secondary | ICD-10-CM | POA: Diagnosis not present

## 2022-06-27 DIAGNOSIS — G4733 Obstructive sleep apnea (adult) (pediatric): Secondary | ICD-10-CM | POA: Diagnosis not present

## 2022-06-27 DIAGNOSIS — J441 Chronic obstructive pulmonary disease with (acute) exacerbation: Secondary | ICD-10-CM | POA: Diagnosis not present

## 2022-06-30 DIAGNOSIS — J441 Chronic obstructive pulmonary disease with (acute) exacerbation: Secondary | ICD-10-CM | POA: Diagnosis not present

## 2022-06-30 DIAGNOSIS — G4733 Obstructive sleep apnea (adult) (pediatric): Secondary | ICD-10-CM | POA: Diagnosis not present

## 2022-06-30 DIAGNOSIS — J9611 Chronic respiratory failure with hypoxia: Secondary | ICD-10-CM | POA: Diagnosis not present

## 2022-07-28 DIAGNOSIS — G4733 Obstructive sleep apnea (adult) (pediatric): Secondary | ICD-10-CM | POA: Diagnosis not present

## 2022-07-28 DIAGNOSIS — J9611 Chronic respiratory failure with hypoxia: Secondary | ICD-10-CM | POA: Diagnosis not present

## 2022-07-28 DIAGNOSIS — J441 Chronic obstructive pulmonary disease with (acute) exacerbation: Secondary | ICD-10-CM | POA: Diagnosis not present

## 2022-07-31 DIAGNOSIS — J441 Chronic obstructive pulmonary disease with (acute) exacerbation: Secondary | ICD-10-CM | POA: Diagnosis not present

## 2022-07-31 DIAGNOSIS — J9611 Chronic respiratory failure with hypoxia: Secondary | ICD-10-CM | POA: Diagnosis not present

## 2022-07-31 DIAGNOSIS — G4733 Obstructive sleep apnea (adult) (pediatric): Secondary | ICD-10-CM | POA: Diagnosis not present

## 2022-08-27 DIAGNOSIS — J441 Chronic obstructive pulmonary disease with (acute) exacerbation: Secondary | ICD-10-CM | POA: Diagnosis not present

## 2022-08-27 DIAGNOSIS — G4733 Obstructive sleep apnea (adult) (pediatric): Secondary | ICD-10-CM | POA: Diagnosis not present

## 2022-08-27 DIAGNOSIS — J9611 Chronic respiratory failure with hypoxia: Secondary | ICD-10-CM | POA: Diagnosis not present

## 2022-08-30 DIAGNOSIS — J9611 Chronic respiratory failure with hypoxia: Secondary | ICD-10-CM | POA: Diagnosis not present

## 2022-08-30 DIAGNOSIS — G4733 Obstructive sleep apnea (adult) (pediatric): Secondary | ICD-10-CM | POA: Diagnosis not present

## 2022-08-30 DIAGNOSIS — J441 Chronic obstructive pulmonary disease with (acute) exacerbation: Secondary | ICD-10-CM | POA: Diagnosis not present

## 2022-09-01 DIAGNOSIS — I10 Essential (primary) hypertension: Secondary | ICD-10-CM | POA: Diagnosis not present

## 2022-09-01 DIAGNOSIS — G4733 Obstructive sleep apnea (adult) (pediatric): Secondary | ICD-10-CM | POA: Diagnosis not present

## 2022-09-01 DIAGNOSIS — J449 Chronic obstructive pulmonary disease, unspecified: Secondary | ICD-10-CM | POA: Diagnosis not present

## 2022-09-04 DIAGNOSIS — E785 Hyperlipidemia, unspecified: Secondary | ICD-10-CM | POA: Diagnosis not present

## 2022-09-04 DIAGNOSIS — M810 Age-related osteoporosis without current pathological fracture: Secondary | ICD-10-CM | POA: Diagnosis not present

## 2022-09-04 DIAGNOSIS — R946 Abnormal results of thyroid function studies: Secondary | ICD-10-CM | POA: Diagnosis not present

## 2022-09-04 DIAGNOSIS — R7989 Other specified abnormal findings of blood chemistry: Secondary | ICD-10-CM | POA: Diagnosis not present

## 2022-09-17 DIAGNOSIS — R82998 Other abnormal findings in urine: Secondary | ICD-10-CM | POA: Diagnosis not present

## 2022-09-17 DIAGNOSIS — I2510 Atherosclerotic heart disease of native coronary artery without angina pectoris: Secondary | ICD-10-CM | POA: Diagnosis not present

## 2022-09-17 DIAGNOSIS — J441 Chronic obstructive pulmonary disease with (acute) exacerbation: Secondary | ICD-10-CM | POA: Diagnosis not present

## 2022-09-17 DIAGNOSIS — J9611 Chronic respiratory failure with hypoxia: Secondary | ICD-10-CM | POA: Diagnosis not present

## 2022-09-17 DIAGNOSIS — Z0000 Encounter for general adult medical examination without abnormal findings: Secondary | ICD-10-CM | POA: Diagnosis not present

## 2022-09-17 DIAGNOSIS — I10 Essential (primary) hypertension: Secondary | ICD-10-CM | POA: Diagnosis not present

## 2022-09-17 DIAGNOSIS — I77810 Thoracic aortic ectasia: Secondary | ICD-10-CM | POA: Diagnosis not present

## 2022-09-17 DIAGNOSIS — E1169 Type 2 diabetes mellitus with other specified complication: Secondary | ICD-10-CM | POA: Diagnosis not present

## 2022-09-17 DIAGNOSIS — G609 Hereditary and idiopathic neuropathy, unspecified: Secondary | ICD-10-CM | POA: Diagnosis not present

## 2022-09-17 DIAGNOSIS — D692 Other nonthrombocytopenic purpura: Secondary | ICD-10-CM | POA: Diagnosis not present

## 2022-09-27 DIAGNOSIS — J441 Chronic obstructive pulmonary disease with (acute) exacerbation: Secondary | ICD-10-CM | POA: Diagnosis not present

## 2022-09-27 DIAGNOSIS — J9611 Chronic respiratory failure with hypoxia: Secondary | ICD-10-CM | POA: Diagnosis not present

## 2022-09-27 DIAGNOSIS — G4733 Obstructive sleep apnea (adult) (pediatric): Secondary | ICD-10-CM | POA: Diagnosis not present

## 2022-09-30 DIAGNOSIS — J9611 Chronic respiratory failure with hypoxia: Secondary | ICD-10-CM | POA: Diagnosis not present

## 2022-09-30 DIAGNOSIS — G4733 Obstructive sleep apnea (adult) (pediatric): Secondary | ICD-10-CM | POA: Diagnosis not present

## 2022-09-30 DIAGNOSIS — J441 Chronic obstructive pulmonary disease with (acute) exacerbation: Secondary | ICD-10-CM | POA: Diagnosis not present

## 2022-10-27 DIAGNOSIS — J441 Chronic obstructive pulmonary disease with (acute) exacerbation: Secondary | ICD-10-CM | POA: Diagnosis not present

## 2022-10-27 DIAGNOSIS — J9611 Chronic respiratory failure with hypoxia: Secondary | ICD-10-CM | POA: Diagnosis not present

## 2022-10-27 DIAGNOSIS — G4733 Obstructive sleep apnea (adult) (pediatric): Secondary | ICD-10-CM | POA: Diagnosis not present

## 2022-10-30 DIAGNOSIS — J9611 Chronic respiratory failure with hypoxia: Secondary | ICD-10-CM | POA: Diagnosis not present

## 2022-10-30 DIAGNOSIS — G4733 Obstructive sleep apnea (adult) (pediatric): Secondary | ICD-10-CM | POA: Diagnosis not present

## 2022-10-30 DIAGNOSIS — J441 Chronic obstructive pulmonary disease with (acute) exacerbation: Secondary | ICD-10-CM | POA: Diagnosis not present

## 2022-11-13 DIAGNOSIS — H40013 Open angle with borderline findings, low risk, bilateral: Secondary | ICD-10-CM | POA: Diagnosis not present

## 2022-11-27 DIAGNOSIS — J9611 Chronic respiratory failure with hypoxia: Secondary | ICD-10-CM | POA: Diagnosis not present

## 2022-11-27 DIAGNOSIS — J441 Chronic obstructive pulmonary disease with (acute) exacerbation: Secondary | ICD-10-CM | POA: Diagnosis not present

## 2022-11-27 DIAGNOSIS — J449 Chronic obstructive pulmonary disease, unspecified: Secondary | ICD-10-CM | POA: Diagnosis not present

## 2022-11-27 DIAGNOSIS — I10 Essential (primary) hypertension: Secondary | ICD-10-CM | POA: Diagnosis not present

## 2022-11-27 DIAGNOSIS — G4733 Obstructive sleep apnea (adult) (pediatric): Secondary | ICD-10-CM | POA: Diagnosis not present

## 2022-11-30 DIAGNOSIS — G4733 Obstructive sleep apnea (adult) (pediatric): Secondary | ICD-10-CM | POA: Diagnosis not present

## 2022-11-30 DIAGNOSIS — J441 Chronic obstructive pulmonary disease with (acute) exacerbation: Secondary | ICD-10-CM | POA: Diagnosis not present

## 2022-11-30 DIAGNOSIS — J9611 Chronic respiratory failure with hypoxia: Secondary | ICD-10-CM | POA: Diagnosis not present

## 2022-12-24 ENCOUNTER — Other Ambulatory Visit (HOSPITAL_COMMUNITY): Payer: Self-pay | Admitting: Adult Health

## 2022-12-24 ENCOUNTER — Ambulatory Visit (HOSPITAL_BASED_OUTPATIENT_CLINIC_OR_DEPARTMENT_OTHER)
Admission: RE | Admit: 2022-12-24 | Discharge: 2022-12-24 | Disposition: A | Discharge status: Home / Self Care | Payer: Medicare HMO | Source: Ambulatory Visit | Attending: Adult Health | Admitting: Adult Health

## 2022-12-24 DIAGNOSIS — R0600 Dyspnea, unspecified: Secondary | ICD-10-CM | POA: Insufficient documentation

## 2022-12-24 DIAGNOSIS — J441 Chronic obstructive pulmonary disease with (acute) exacerbation: Secondary | ICD-10-CM | POA: Diagnosis not present

## 2022-12-24 DIAGNOSIS — J9611 Chronic respiratory failure with hypoxia: Secondary | ICD-10-CM | POA: Diagnosis not present

## 2022-12-24 DIAGNOSIS — I2510 Atherosclerotic heart disease of native coronary artery without angina pectoris: Secondary | ICD-10-CM | POA: Diagnosis not present

## 2022-12-24 DIAGNOSIS — R918 Other nonspecific abnormal finding of lung field: Secondary | ICD-10-CM | POA: Diagnosis not present

## 2022-12-24 DIAGNOSIS — R0902 Hypoxemia: Secondary | ICD-10-CM | POA: Diagnosis not present

## 2022-12-24 DIAGNOSIS — D649 Anemia, unspecified: Secondary | ICD-10-CM | POA: Diagnosis not present

## 2022-12-24 DIAGNOSIS — R590 Localized enlarged lymph nodes: Secondary | ICD-10-CM | POA: Diagnosis not present

## 2022-12-24 MED ORDER — IOHEXOL 350 MG/ML SOLN
100.0000 mL | Freq: Once | INTRAVENOUS | Status: AC | PRN
  Administered 2022-12-24: 75 mL via INTRAVENOUS

## 2022-12-28 DIAGNOSIS — J9611 Chronic respiratory failure with hypoxia: Secondary | ICD-10-CM | POA: Diagnosis not present

## 2022-12-28 DIAGNOSIS — G4733 Obstructive sleep apnea (adult) (pediatric): Secondary | ICD-10-CM | POA: Diagnosis not present

## 2022-12-28 DIAGNOSIS — J441 Chronic obstructive pulmonary disease with (acute) exacerbation: Secondary | ICD-10-CM | POA: Diagnosis not present

## 2022-12-31 DIAGNOSIS — J441 Chronic obstructive pulmonary disease with (acute) exacerbation: Secondary | ICD-10-CM | POA: Diagnosis not present

## 2022-12-31 DIAGNOSIS — J9611 Chronic respiratory failure with hypoxia: Secondary | ICD-10-CM | POA: Diagnosis not present

## 2022-12-31 DIAGNOSIS — G4733 Obstructive sleep apnea (adult) (pediatric): Secondary | ICD-10-CM | POA: Diagnosis not present

## 2023-01-11 DIAGNOSIS — D649 Anemia, unspecified: Secondary | ICD-10-CM | POA: Diagnosis not present

## 2023-01-11 DIAGNOSIS — Z23 Encounter for immunization: Secondary | ICD-10-CM | POA: Diagnosis not present

## 2023-01-11 DIAGNOSIS — J441 Chronic obstructive pulmonary disease with (acute) exacerbation: Secondary | ICD-10-CM | POA: Diagnosis not present

## 2023-01-11 DIAGNOSIS — J9611 Chronic respiratory failure with hypoxia: Secondary | ICD-10-CM | POA: Diagnosis not present

## 2023-01-11 DIAGNOSIS — R0902 Hypoxemia: Secondary | ICD-10-CM | POA: Diagnosis not present

## 2023-01-27 DIAGNOSIS — J441 Chronic obstructive pulmonary disease with (acute) exacerbation: Secondary | ICD-10-CM | POA: Diagnosis not present

## 2023-01-27 DIAGNOSIS — J9611 Chronic respiratory failure with hypoxia: Secondary | ICD-10-CM | POA: Diagnosis not present

## 2023-01-27 DIAGNOSIS — G4733 Obstructive sleep apnea (adult) (pediatric): Secondary | ICD-10-CM | POA: Diagnosis not present

## 2023-01-30 DIAGNOSIS — G4733 Obstructive sleep apnea (adult) (pediatric): Secondary | ICD-10-CM | POA: Diagnosis not present

## 2023-01-30 DIAGNOSIS — J9611 Chronic respiratory failure with hypoxia: Secondary | ICD-10-CM | POA: Diagnosis not present

## 2023-01-30 DIAGNOSIS — J441 Chronic obstructive pulmonary disease with (acute) exacerbation: Secondary | ICD-10-CM | POA: Diagnosis not present

## 2023-02-09 DIAGNOSIS — D649 Anemia, unspecified: Secondary | ICD-10-CM | POA: Diagnosis not present

## 2023-02-09 DIAGNOSIS — Z0000 Encounter for general adult medical examination without abnormal findings: Secondary | ICD-10-CM | POA: Diagnosis not present

## 2023-02-09 DIAGNOSIS — Z0189 Encounter for other specified special examinations: Secondary | ICD-10-CM | POA: Diagnosis not present

## 2023-02-09 DIAGNOSIS — I10 Essential (primary) hypertension: Secondary | ICD-10-CM | POA: Diagnosis not present

## 2023-02-27 DIAGNOSIS — J441 Chronic obstructive pulmonary disease with (acute) exacerbation: Secondary | ICD-10-CM | POA: Diagnosis not present

## 2023-02-27 DIAGNOSIS — J9611 Chronic respiratory failure with hypoxia: Secondary | ICD-10-CM | POA: Diagnosis not present

## 2023-02-27 DIAGNOSIS — G4733 Obstructive sleep apnea (adult) (pediatric): Secondary | ICD-10-CM | POA: Diagnosis not present

## 2023-03-02 DIAGNOSIS — G4733 Obstructive sleep apnea (adult) (pediatric): Secondary | ICD-10-CM | POA: Diagnosis not present

## 2023-03-02 DIAGNOSIS — J449 Chronic obstructive pulmonary disease, unspecified: Secondary | ICD-10-CM | POA: Diagnosis not present

## 2023-03-02 DIAGNOSIS — J9611 Chronic respiratory failure with hypoxia: Secondary | ICD-10-CM | POA: Diagnosis not present

## 2023-03-02 DIAGNOSIS — I10 Essential (primary) hypertension: Secondary | ICD-10-CM | POA: Diagnosis not present

## 2023-03-02 DIAGNOSIS — J441 Chronic obstructive pulmonary disease with (acute) exacerbation: Secondary | ICD-10-CM | POA: Diagnosis not present

## 2023-03-22 DIAGNOSIS — R0902 Hypoxemia: Secondary | ICD-10-CM | POA: Diagnosis not present

## 2023-03-22 DIAGNOSIS — M810 Age-related osteoporosis without current pathological fracture: Secondary | ICD-10-CM | POA: Diagnosis not present

## 2023-03-22 DIAGNOSIS — D692 Other nonthrombocytopenic purpura: Secondary | ICD-10-CM | POA: Diagnosis not present

## 2023-03-22 DIAGNOSIS — G609 Hereditary and idiopathic neuropathy, unspecified: Secondary | ICD-10-CM | POA: Diagnosis not present

## 2023-03-22 DIAGNOSIS — J441 Chronic obstructive pulmonary disease with (acute) exacerbation: Secondary | ICD-10-CM | POA: Diagnosis not present

## 2023-03-22 DIAGNOSIS — I77810 Thoracic aortic ectasia: Secondary | ICD-10-CM | POA: Diagnosis not present

## 2023-03-22 DIAGNOSIS — J45909 Unspecified asthma, uncomplicated: Secondary | ICD-10-CM | POA: Diagnosis not present

## 2023-03-22 DIAGNOSIS — J9611 Chronic respiratory failure with hypoxia: Secondary | ICD-10-CM | POA: Diagnosis not present

## 2023-03-22 DIAGNOSIS — E1169 Type 2 diabetes mellitus with other specified complication: Secondary | ICD-10-CM | POA: Diagnosis not present

## 2023-03-22 DIAGNOSIS — I2510 Atherosclerotic heart disease of native coronary artery without angina pectoris: Secondary | ICD-10-CM | POA: Diagnosis not present

## 2023-03-22 DIAGNOSIS — D649 Anemia, unspecified: Secondary | ICD-10-CM | POA: Diagnosis not present

## 2023-03-29 DIAGNOSIS — J9611 Chronic respiratory failure with hypoxia: Secondary | ICD-10-CM | POA: Diagnosis not present

## 2023-03-29 DIAGNOSIS — J441 Chronic obstructive pulmonary disease with (acute) exacerbation: Secondary | ICD-10-CM | POA: Diagnosis not present

## 2023-03-29 DIAGNOSIS — G4733 Obstructive sleep apnea (adult) (pediatric): Secondary | ICD-10-CM | POA: Diagnosis not present

## 2023-04-01 DIAGNOSIS — J441 Chronic obstructive pulmonary disease with (acute) exacerbation: Secondary | ICD-10-CM | POA: Diagnosis not present

## 2023-04-01 DIAGNOSIS — G4733 Obstructive sleep apnea (adult) (pediatric): Secondary | ICD-10-CM | POA: Diagnosis not present

## 2023-04-01 DIAGNOSIS — J9611 Chronic respiratory failure with hypoxia: Secondary | ICD-10-CM | POA: Diagnosis not present

## 2023-04-09 DIAGNOSIS — N390 Urinary tract infection, site not specified: Secondary | ICD-10-CM | POA: Diagnosis not present

## 2023-04-14 DIAGNOSIS — B372 Candidiasis of skin and nail: Secondary | ICD-10-CM | POA: Diagnosis not present

## 2023-04-14 DIAGNOSIS — L308 Other specified dermatitis: Secondary | ICD-10-CM | POA: Diagnosis not present

## 2023-04-14 DIAGNOSIS — E1169 Type 2 diabetes mellitus with other specified complication: Secondary | ICD-10-CM | POA: Diagnosis not present

## 2023-04-29 DIAGNOSIS — J9611 Chronic respiratory failure with hypoxia: Secondary | ICD-10-CM | POA: Diagnosis not present

## 2023-04-29 DIAGNOSIS — G4733 Obstructive sleep apnea (adult) (pediatric): Secondary | ICD-10-CM | POA: Diagnosis not present

## 2023-04-29 DIAGNOSIS — J441 Chronic obstructive pulmonary disease with (acute) exacerbation: Secondary | ICD-10-CM | POA: Diagnosis not present

## 2023-04-30 DIAGNOSIS — N390 Urinary tract infection, site not specified: Secondary | ICD-10-CM | POA: Diagnosis not present

## 2023-05-02 DIAGNOSIS — J9611 Chronic respiratory failure with hypoxia: Secondary | ICD-10-CM | POA: Diagnosis not present

## 2023-05-02 DIAGNOSIS — J441 Chronic obstructive pulmonary disease with (acute) exacerbation: Secondary | ICD-10-CM | POA: Diagnosis not present

## 2023-05-02 DIAGNOSIS — G4733 Obstructive sleep apnea (adult) (pediatric): Secondary | ICD-10-CM | POA: Diagnosis not present

## 2023-05-05 DIAGNOSIS — I739 Peripheral vascular disease, unspecified: Secondary | ICD-10-CM | POA: Diagnosis not present

## 2023-05-05 DIAGNOSIS — I87331 Chronic venous hypertension (idiopathic) with ulcer and inflammation of right lower extremity: Secondary | ICD-10-CM | POA: Diagnosis not present

## 2023-05-05 DIAGNOSIS — I872 Venous insufficiency (chronic) (peripheral): Secondary | ICD-10-CM | POA: Diagnosis not present

## 2023-05-30 DIAGNOSIS — J441 Chronic obstructive pulmonary disease with (acute) exacerbation: Secondary | ICD-10-CM | POA: Diagnosis not present

## 2023-05-30 DIAGNOSIS — J9611 Chronic respiratory failure with hypoxia: Secondary | ICD-10-CM | POA: Diagnosis not present

## 2023-05-30 DIAGNOSIS — G4733 Obstructive sleep apnea (adult) (pediatric): Secondary | ICD-10-CM | POA: Diagnosis not present

## 2023-05-31 DIAGNOSIS — N390 Urinary tract infection, site not specified: Secondary | ICD-10-CM | POA: Diagnosis not present

## 2023-06-02 DIAGNOSIS — G4733 Obstructive sleep apnea (adult) (pediatric): Secondary | ICD-10-CM | POA: Diagnosis not present

## 2023-06-02 DIAGNOSIS — J441 Chronic obstructive pulmonary disease with (acute) exacerbation: Secondary | ICD-10-CM | POA: Diagnosis not present

## 2023-06-02 DIAGNOSIS — J9611 Chronic respiratory failure with hypoxia: Secondary | ICD-10-CM | POA: Diagnosis not present

## 2023-06-08 DIAGNOSIS — I10 Essential (primary) hypertension: Secondary | ICD-10-CM | POA: Diagnosis not present

## 2023-06-08 DIAGNOSIS — J449 Chronic obstructive pulmonary disease, unspecified: Secondary | ICD-10-CM | POA: Diagnosis not present

## 2023-06-08 DIAGNOSIS — G4733 Obstructive sleep apnea (adult) (pediatric): Secondary | ICD-10-CM | POA: Diagnosis not present

## 2023-06-23 DIAGNOSIS — H40013 Open angle with borderline findings, low risk, bilateral: Secondary | ICD-10-CM | POA: Diagnosis not present

## 2023-06-27 DIAGNOSIS — G4733 Obstructive sleep apnea (adult) (pediatric): Secondary | ICD-10-CM | POA: Diagnosis not present

## 2023-06-27 DIAGNOSIS — J441 Chronic obstructive pulmonary disease with (acute) exacerbation: Secondary | ICD-10-CM | POA: Diagnosis not present

## 2023-06-27 DIAGNOSIS — J9611 Chronic respiratory failure with hypoxia: Secondary | ICD-10-CM | POA: Diagnosis not present

## 2023-06-30 DIAGNOSIS — J9611 Chronic respiratory failure with hypoxia: Secondary | ICD-10-CM | POA: Diagnosis not present

## 2023-06-30 DIAGNOSIS — G4733 Obstructive sleep apnea (adult) (pediatric): Secondary | ICD-10-CM | POA: Diagnosis not present

## 2023-06-30 DIAGNOSIS — J441 Chronic obstructive pulmonary disease with (acute) exacerbation: Secondary | ICD-10-CM | POA: Diagnosis not present

## 2023-07-07 DIAGNOSIS — N3020 Other chronic cystitis without hematuria: Secondary | ICD-10-CM | POA: Diagnosis not present

## 2023-07-21 DIAGNOSIS — I10 Essential (primary) hypertension: Secondary | ICD-10-CM | POA: Diagnosis not present

## 2023-07-21 DIAGNOSIS — E1169 Type 2 diabetes mellitus with other specified complication: Secondary | ICD-10-CM | POA: Diagnosis not present

## 2023-07-28 DIAGNOSIS — G4733 Obstructive sleep apnea (adult) (pediatric): Secondary | ICD-10-CM | POA: Diagnosis not present

## 2023-07-28 DIAGNOSIS — J441 Chronic obstructive pulmonary disease with (acute) exacerbation: Secondary | ICD-10-CM | POA: Diagnosis not present

## 2023-07-28 DIAGNOSIS — J9611 Chronic respiratory failure with hypoxia: Secondary | ICD-10-CM | POA: Diagnosis not present

## 2023-07-31 DIAGNOSIS — G4733 Obstructive sleep apnea (adult) (pediatric): Secondary | ICD-10-CM | POA: Diagnosis not present

## 2023-07-31 DIAGNOSIS — J441 Chronic obstructive pulmonary disease with (acute) exacerbation: Secondary | ICD-10-CM | POA: Diagnosis not present

## 2023-07-31 DIAGNOSIS — J9611 Chronic respiratory failure with hypoxia: Secondary | ICD-10-CM | POA: Diagnosis not present

## 2023-08-27 DIAGNOSIS — J441 Chronic obstructive pulmonary disease with (acute) exacerbation: Secondary | ICD-10-CM | POA: Diagnosis not present

## 2023-08-27 DIAGNOSIS — G4733 Obstructive sleep apnea (adult) (pediatric): Secondary | ICD-10-CM | POA: Diagnosis not present

## 2023-08-27 DIAGNOSIS — J9611 Chronic respiratory failure with hypoxia: Secondary | ICD-10-CM | POA: Diagnosis not present

## 2023-08-30 DIAGNOSIS — J9611 Chronic respiratory failure with hypoxia: Secondary | ICD-10-CM | POA: Diagnosis not present

## 2023-08-30 DIAGNOSIS — G4733 Obstructive sleep apnea (adult) (pediatric): Secondary | ICD-10-CM | POA: Diagnosis not present

## 2023-08-30 DIAGNOSIS — J441 Chronic obstructive pulmonary disease with (acute) exacerbation: Secondary | ICD-10-CM | POA: Diagnosis not present

## 2023-09-07 DIAGNOSIS — I10 Essential (primary) hypertension: Secondary | ICD-10-CM | POA: Diagnosis not present

## 2023-09-07 DIAGNOSIS — J449 Chronic obstructive pulmonary disease, unspecified: Secondary | ICD-10-CM | POA: Diagnosis not present

## 2023-09-07 DIAGNOSIS — G4733 Obstructive sleep apnea (adult) (pediatric): Secondary | ICD-10-CM | POA: Diagnosis not present

## 2023-09-27 DIAGNOSIS — G4733 Obstructive sleep apnea (adult) (pediatric): Secondary | ICD-10-CM | POA: Diagnosis not present

## 2023-09-27 DIAGNOSIS — J441 Chronic obstructive pulmonary disease with (acute) exacerbation: Secondary | ICD-10-CM | POA: Diagnosis not present

## 2023-09-27 DIAGNOSIS — J9611 Chronic respiratory failure with hypoxia: Secondary | ICD-10-CM | POA: Diagnosis not present

## 2023-09-30 DIAGNOSIS — J441 Chronic obstructive pulmonary disease with (acute) exacerbation: Secondary | ICD-10-CM | POA: Diagnosis not present

## 2023-09-30 DIAGNOSIS — G4733 Obstructive sleep apnea (adult) (pediatric): Secondary | ICD-10-CM | POA: Diagnosis not present

## 2023-09-30 DIAGNOSIS — J9611 Chronic respiratory failure with hypoxia: Secondary | ICD-10-CM | POA: Diagnosis not present

## 2023-10-27 DIAGNOSIS — J441 Chronic obstructive pulmonary disease with (acute) exacerbation: Secondary | ICD-10-CM | POA: Diagnosis not present

## 2023-10-27 DIAGNOSIS — J9611 Chronic respiratory failure with hypoxia: Secondary | ICD-10-CM | POA: Diagnosis not present

## 2023-10-27 DIAGNOSIS — G4733 Obstructive sleep apnea (adult) (pediatric): Secondary | ICD-10-CM | POA: Diagnosis not present

## 2023-10-30 DIAGNOSIS — G4733 Obstructive sleep apnea (adult) (pediatric): Secondary | ICD-10-CM | POA: Diagnosis not present

## 2023-10-30 DIAGNOSIS — J441 Chronic obstructive pulmonary disease with (acute) exacerbation: Secondary | ICD-10-CM | POA: Diagnosis not present

## 2023-10-30 DIAGNOSIS — J9611 Chronic respiratory failure with hypoxia: Secondary | ICD-10-CM | POA: Diagnosis not present

## 2023-11-02 DIAGNOSIS — E7849 Other hyperlipidemia: Secondary | ICD-10-CM | POA: Diagnosis not present

## 2023-11-02 DIAGNOSIS — D539 Nutritional anemia, unspecified: Secondary | ICD-10-CM | POA: Diagnosis not present

## 2023-11-02 DIAGNOSIS — R946 Abnormal results of thyroid function studies: Secondary | ICD-10-CM | POA: Diagnosis not present

## 2023-11-02 DIAGNOSIS — M810 Age-related osteoporosis without current pathological fracture: Secondary | ICD-10-CM | POA: Diagnosis not present

## 2023-11-02 DIAGNOSIS — Z1212 Encounter for screening for malignant neoplasm of rectum: Secondary | ICD-10-CM | POA: Diagnosis not present

## 2023-11-11 DIAGNOSIS — R82998 Other abnormal findings in urine: Secondary | ICD-10-CM | POA: Diagnosis not present

## 2023-11-24 DIAGNOSIS — J449 Chronic obstructive pulmonary disease, unspecified: Secondary | ICD-10-CM | POA: Diagnosis not present

## 2023-11-24 DIAGNOSIS — G4733 Obstructive sleep apnea (adult) (pediatric): Secondary | ICD-10-CM | POA: Diagnosis not present

## 2023-11-24 DIAGNOSIS — J9610 Chronic respiratory failure, unspecified whether with hypoxia or hypercapnia: Secondary | ICD-10-CM | POA: Diagnosis not present

## 2023-11-27 DIAGNOSIS — G4733 Obstructive sleep apnea (adult) (pediatric): Secondary | ICD-10-CM | POA: Diagnosis not present

## 2023-11-27 DIAGNOSIS — J441 Chronic obstructive pulmonary disease with (acute) exacerbation: Secondary | ICD-10-CM | POA: Diagnosis not present

## 2023-11-27 DIAGNOSIS — J9611 Chronic respiratory failure with hypoxia: Secondary | ICD-10-CM | POA: Diagnosis not present

## 2023-11-30 DIAGNOSIS — G4733 Obstructive sleep apnea (adult) (pediatric): Secondary | ICD-10-CM | POA: Diagnosis not present

## 2023-11-30 DIAGNOSIS — J9611 Chronic respiratory failure with hypoxia: Secondary | ICD-10-CM | POA: Diagnosis not present

## 2023-11-30 DIAGNOSIS — J441 Chronic obstructive pulmonary disease with (acute) exacerbation: Secondary | ICD-10-CM | POA: Diagnosis not present

## 2023-12-08 DIAGNOSIS — H40013 Open angle with borderline findings, low risk, bilateral: Secondary | ICD-10-CM | POA: Diagnosis not present

## 2023-12-28 DIAGNOSIS — G4733 Obstructive sleep apnea (adult) (pediatric): Secondary | ICD-10-CM | POA: Diagnosis not present

## 2023-12-28 DIAGNOSIS — J441 Chronic obstructive pulmonary disease with (acute) exacerbation: Secondary | ICD-10-CM | POA: Diagnosis not present

## 2023-12-28 DIAGNOSIS — J9611 Chronic respiratory failure with hypoxia: Secondary | ICD-10-CM | POA: Diagnosis not present

## 2023-12-31 DIAGNOSIS — J441 Chronic obstructive pulmonary disease with (acute) exacerbation: Secondary | ICD-10-CM | POA: Diagnosis not present

## 2023-12-31 DIAGNOSIS — G4733 Obstructive sleep apnea (adult) (pediatric): Secondary | ICD-10-CM | POA: Diagnosis not present

## 2023-12-31 DIAGNOSIS — J9611 Chronic respiratory failure with hypoxia: Secondary | ICD-10-CM | POA: Diagnosis not present

## 2024-01-12 DIAGNOSIS — N3020 Other chronic cystitis without hematuria: Secondary | ICD-10-CM | POA: Diagnosis not present

## 2024-01-12 DIAGNOSIS — I10 Essential (primary) hypertension: Secondary | ICD-10-CM | POA: Diagnosis not present

## 2024-01-12 DIAGNOSIS — E1169 Type 2 diabetes mellitus with other specified complication: Secondary | ICD-10-CM | POA: Diagnosis not present

## 2024-01-12 DIAGNOSIS — E785 Hyperlipidemia, unspecified: Secondary | ICD-10-CM | POA: Diagnosis not present

## 2024-01-27 DIAGNOSIS — G4733 Obstructive sleep apnea (adult) (pediatric): Secondary | ICD-10-CM | POA: Diagnosis not present

## 2024-01-27 DIAGNOSIS — J9611 Chronic respiratory failure with hypoxia: Secondary | ICD-10-CM | POA: Diagnosis not present

## 2024-01-27 DIAGNOSIS — J441 Chronic obstructive pulmonary disease with (acute) exacerbation: Secondary | ICD-10-CM | POA: Diagnosis not present

## 2024-01-29 DIAGNOSIS — Z23 Encounter for immunization: Secondary | ICD-10-CM | POA: Diagnosis not present

## 2024-01-30 DIAGNOSIS — G4733 Obstructive sleep apnea (adult) (pediatric): Secondary | ICD-10-CM | POA: Diagnosis not present

## 2024-01-30 DIAGNOSIS — J441 Chronic obstructive pulmonary disease with (acute) exacerbation: Secondary | ICD-10-CM | POA: Diagnosis not present

## 2024-02-04 DIAGNOSIS — N3020 Other chronic cystitis without hematuria: Secondary | ICD-10-CM | POA: Diagnosis not present

## 2024-02-04 DIAGNOSIS — R399 Unspecified symptoms and signs involving the genitourinary system: Secondary | ICD-10-CM | POA: Diagnosis not present

## 2024-02-27 DIAGNOSIS — J9611 Chronic respiratory failure with hypoxia: Secondary | ICD-10-CM | POA: Diagnosis not present

## 2024-02-27 DIAGNOSIS — G4733 Obstructive sleep apnea (adult) (pediatric): Secondary | ICD-10-CM | POA: Diagnosis not present

## 2024-02-27 DIAGNOSIS — J441 Chronic obstructive pulmonary disease with (acute) exacerbation: Secondary | ICD-10-CM | POA: Diagnosis not present

## 2024-03-01 DIAGNOSIS — G4733 Obstructive sleep apnea (adult) (pediatric): Secondary | ICD-10-CM | POA: Diagnosis not present

## 2024-03-01 DIAGNOSIS — J441 Chronic obstructive pulmonary disease with (acute) exacerbation: Secondary | ICD-10-CM | POA: Diagnosis not present

## 2024-03-04 DIAGNOSIS — G4733 Obstructive sleep apnea (adult) (pediatric): Secondary | ICD-10-CM | POA: Diagnosis not present

## 2024-03-10 DIAGNOSIS — N3020 Other chronic cystitis without hematuria: Secondary | ICD-10-CM | POA: Diagnosis not present

## 2024-03-10 DIAGNOSIS — R350 Frequency of micturition: Secondary | ICD-10-CM | POA: Diagnosis not present

## 2024-03-10 DIAGNOSIS — R399 Unspecified symptoms and signs involving the genitourinary system: Secondary | ICD-10-CM | POA: Diagnosis not present

## 2024-03-15 DIAGNOSIS — I10 Essential (primary) hypertension: Secondary | ICD-10-CM | POA: Diagnosis not present

## 2024-03-15 DIAGNOSIS — G4733 Obstructive sleep apnea (adult) (pediatric): Secondary | ICD-10-CM | POA: Diagnosis not present

## 2024-03-28 DIAGNOSIS — G4733 Obstructive sleep apnea (adult) (pediatric): Secondary | ICD-10-CM | POA: Diagnosis not present

## 2024-03-28 DIAGNOSIS — J9611 Chronic respiratory failure with hypoxia: Secondary | ICD-10-CM | POA: Diagnosis not present

## 2024-03-28 DIAGNOSIS — J441 Chronic obstructive pulmonary disease with (acute) exacerbation: Secondary | ICD-10-CM | POA: Diagnosis not present

## 2024-03-31 DIAGNOSIS — J9611 Chronic respiratory failure with hypoxia: Secondary | ICD-10-CM | POA: Diagnosis not present

## 2024-03-31 DIAGNOSIS — G4733 Obstructive sleep apnea (adult) (pediatric): Secondary | ICD-10-CM | POA: Diagnosis not present

## 2024-03-31 DIAGNOSIS — J441 Chronic obstructive pulmonary disease with (acute) exacerbation: Secondary | ICD-10-CM | POA: Diagnosis not present

## 2024-04-05 DIAGNOSIS — N3020 Other chronic cystitis without hematuria: Secondary | ICD-10-CM | POA: Diagnosis not present

## 2024-04-05 DIAGNOSIS — R300 Dysuria: Secondary | ICD-10-CM | POA: Diagnosis not present

## 2024-04-14 DIAGNOSIS — I10 Essential (primary) hypertension: Secondary | ICD-10-CM | POA: Diagnosis not present

## 2024-04-14 DIAGNOSIS — G4733 Obstructive sleep apnea (adult) (pediatric): Secondary | ICD-10-CM | POA: Diagnosis not present
